# Patient Record
Sex: Female | Born: 1990
Health system: Southern US, Community
[De-identification: ages and names within clinical notes are randomized; demographics above are authoritative.]

## PROBLEM LIST (undated history)

## (undated) ENCOUNTER — Inpatient Hospital Stay (HOSPITAL_COMMUNITY): Payer: Self-pay

## (undated) DIAGNOSIS — I471 Supraventricular tachycardia, unspecified: Secondary | ICD-10-CM

## (undated) DIAGNOSIS — Z720 Tobacco use: Secondary | ICD-10-CM

## (undated) DIAGNOSIS — N390 Urinary tract infection, site not specified: Secondary | ICD-10-CM

## (undated) DIAGNOSIS — J309 Allergic rhinitis, unspecified: Secondary | ICD-10-CM

## (undated) HISTORY — DX: Tobacco use: Z72.0

## (undated) HISTORY — PX: NO PAST SURGERIES: SHX2092

## (undated) HISTORY — DX: Allergic rhinitis, unspecified: J30.9

---
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
## 2010-03-17 ENCOUNTER — Emergency Department (HOSPITAL_COMMUNITY): Admission: EM | Admit: 2010-03-17 | Discharge: 2010-03-17 | Payer: Self-pay | Admitting: Emergency Medicine

## 2010-04-07 ENCOUNTER — Encounter (INDEPENDENT_AMBULATORY_CARE_PROVIDER_SITE_OTHER): Payer: Self-pay | Admitting: *Deleted

## 2010-04-07 ENCOUNTER — Encounter: Payer: Self-pay | Admitting: Cardiology

## 2010-04-07 ENCOUNTER — Ambulatory Visit: Payer: Self-pay | Admitting: Cardiology

## 2010-04-07 DIAGNOSIS — R42 Dizziness and giddiness: Secondary | ICD-10-CM

## 2010-04-07 DIAGNOSIS — R002 Palpitations: Secondary | ICD-10-CM | POA: Insufficient documentation

## 2010-04-07 DIAGNOSIS — R0602 Shortness of breath: Secondary | ICD-10-CM

## 2010-04-08 LAB — CONVERTED CEMR LAB
Basophils Absolute: 0.1 10*3/uL (ref 0.0–0.1)
Basophils Relative: 0.4 % (ref 0.0–3.0)
Eosinophils Absolute: 0.2 10*3/uL (ref 0.0–0.7)
Eosinophils Relative: 1.7 % (ref 0.0–5.0)
Hemoglobin: 14.8 g/dL (ref 12.0–15.0)
Lymphocytes Relative: 20.3 % (ref 12.0–46.0)
Lymphocytes Relative: 21.3 % (ref 12.0–46.0)
MCHC: 34.1 g/dL (ref 30.0–36.0)
Monocytes Relative: 4.7 % (ref 3.0–12.0)
Monocytes Relative: 5.2 % (ref 3.0–12.0)
Neutro Abs: 6.7 10*3/uL (ref 1.4–7.7)
Neutrophils Relative %: 72.4 % (ref 43.0–77.0)
Platelets: 207 10*3/uL (ref 150.0–400.0)
RBC: 4.64 M/uL (ref 3.87–5.11)
RDW: 13.8 % (ref 11.5–14.6)
WBC: 9.1 10*3/uL (ref 4.5–10.5)
WBC: 9.4 10*3/uL (ref 4.5–10.5)

## 2010-04-19 ENCOUNTER — Encounter: Payer: Self-pay | Admitting: Cardiology

## 2010-04-19 ENCOUNTER — Ambulatory Visit: Payer: Self-pay

## 2010-04-19 ENCOUNTER — Ambulatory Visit: Payer: Self-pay | Admitting: Cardiovascular Disease

## 2010-04-19 ENCOUNTER — Ambulatory Visit (HOSPITAL_COMMUNITY): Admission: RE | Admit: 2010-04-19 | Discharge: 2010-04-19 | Payer: Self-pay | Admitting: Cardiology

## 2010-04-26 ENCOUNTER — Emergency Department (HOSPITAL_COMMUNITY): Admission: EM | Admit: 2010-04-26 | Discharge: 2010-04-26 | Payer: Self-pay | Admitting: Emergency Medicine

## 2010-06-18 ENCOUNTER — Encounter (INDEPENDENT_AMBULATORY_CARE_PROVIDER_SITE_OTHER): Payer: Self-pay | Admitting: *Deleted

## 2010-08-19 NOTE — Assessment & Plan Note (Signed)
Summary: eph/palp   CC:  pt complains of sob.  History of Present Illness: This is a 20 year old white female patient who recently went to the emergency room with palpitations. She states that her heartbeat and racing when she was cleaning her house and she bent over. She became dizzy and the whole episode lasted about 2 hours. In the emergency room she had sinus tachycardia at about 130 beats per minute. She was drinking 3-4 cans of caffeinated soda daily. She has cut back on this. She also continues to smoke 5 cigarettes a day and says she has cut back on this as well. She has had 4 episodes over the past 2 years, similar to this.  The patient continues to complain of fast heartbeat associated with dyspnea. She denies any stress or anxiety. She does not work or go to school and currently is caring for her 72-month-old baby. She denies chest pain pain at rest, or syncope. She denies any childhood illnesses out of the ordinary.She does not exercise regularly.  Current Medications (verified): 1)  None  Past History:  Past Medical History: Last updated: 04/06/2010 none   Family History: Reviewed history from 04/06/2010 and no changes required.  Paternal grandmother with high blood pressure and   diabetes.  Aunt and Grandfather with CABG  Social History: Reviewed history from 04/06/2010 and no changes required.  non-drinker, no drug abuse, not employed   27 month old baby  Smokes 5-6 cigarettes/day  Review of Systems       see the history of present illness, otherwise negative  Vital Signs:  Patient profile:   20 year old female Height:      67 inches Weight:      120 pounds BMI:     18.86 Pulse rate:   108 / minute Resp:     12 per minute BP sitting:   103 / 66  (left arm)  Vitals Entered By: Kem Parkinson (April 07, 2010 9:07 AM)  Physical Exam  General:   Well-nournished, in no acute distress. Neck: No JVD, HJR, Bruit, or thyroid enlargement Lungs: No  tachypnea, clear without wheezing, rales, or rhonchi Cardiovascular: RRR at 120 beats per minute, PMI  not displaced, midsystolic click, no murmurs, gallops, bruit, thrill, or heave. Abdomen: BS normal. Soft without organomegaly, masses, lesions or tenderness. Extremities: without cyanosis, clubbing or edema. Good distal pulses bilateral SKin: Warm, no lesions or rashes  Musculoskeletal: No deformities Neuro: no focal signs    EKG  Procedure date:  03/17/2010  Findings:      EKG from the emergency room showed sinus tachycardia at 131 beats per minute  Impression & Recommendations:  Problem # 1:  PALPITATIONS (ICD-785.1) Patient has an increase in palpitations associated with sinus tachycardia and dyspnea. She does drink excessive caffeine but has cut back on it. She seems somewhat anxious in the office and increases her heart rate while she is here. Will check a 2-D echo, and event recorder. We will also check a TSH and the below stated labs. Orders: TLB-TSH (Thyroid Stimulating Hormone) ([id number]) Echocardiogram (Echo) TLB-CBC Platelet - w/Differential ([id number]) T-D-Dimer Fibrin Derivatives Quantitive ([id number]) Event (Event)  Problem # 2:  TOBACCO ABUSE (ICD-305.1) I encouraged the patient to stop smoking.  Problem # 3:  DYSPNEA (ICD-786.05) dyspnea his eyes associated with the palpitations.  Patient Instructions: 1)  Your physician recommends that you schedule a follow-up appointment in:  FIRST AVAILABLE WITH ALLRED 2)  Your physician recommends that you return for lab  work in: today CBC  TSH D-DIMER 785.1 3)  Your physician has requested that you have an echocardiogram.  Echocardiography is a painless test that uses sound waves to create images of your heart. It provides your doctor with information about the size and shape of your heart and how well your heart's chambers and valves are working.  This procedure takes approximately one hour. There are no restrictions  for this procedure. 4)  Your physician has recommended that you wear an event monitor.  Event monitors are medical devices that record the heart's electrical activity. Doctors most often use these monitors to diagnose arrhythmias. Arrhythmias are problems with the speed or rhythm of the heartbeat. The monitor is a small, portable device. You can wear one while you do your normal daily activities. This is usually used to diagnose what is causing palpitations/syncope (passing out).

## 2010-08-19 NOTE — Miscellaneous (Signed)
Summary: EVENT MONITOR WILL BE MAIL TO PATIENT.  Clinical Lists Changes  PATIENT WAS HERE FOR APPT TODAY SHE NEED A EVEVT MONITOR  BUT THE MONITOR SHE NEEDED  WOULD HAVE  TO BE MAIL TO HER.

## 2010-08-19 NOTE — Letter (Signed)
Summary: Appointment - Missed  [facility], Main Office  [address]   [place], [place] [address]   Phone: [phone]  Fax: [phone]     June 18, 2010 MRN: [id number]   [facility] [place] [address] [place], [place]  [address]   Dear Ms. Fromer,  Our records indicate you missed your appointment on 06-09-10 with Dr. Johney Frame.                                    It is very important that we reach you to reschedule this appointment. We look forward to participating in your health care needs. Please contact us at the number listed above at your earliest convenience to reschedule this appointment.     Sincerely,    Glass blower/designer

## 2010-09-30 LAB — POCT URINALYSIS DIPSTICK
Bilirubin Urine: NEGATIVE
Nitrite: NEGATIVE
Protein, ur: NEGATIVE mg/dL
pH: 6 (ref 5.0–8.0)

## 2010-09-30 LAB — GC/CHLAMYDIA PROBE AMP, GENITAL
Chlamydia, DNA Probe: NEGATIVE
GC Probe Amp, Genital: NEGATIVE

## 2010-09-30 LAB — WET PREP, GENITAL
Trich, Wet Prep: NONE SEEN
Yeast Wet Prep HPF POC: NONE SEEN

## 2010-10-01 LAB — POCT I-STAT, CHEM 8
Creatinine, Ser: 0.8 mg/dL (ref 0.4–1.2)
Glucose, Bld: 78 mg/dL (ref 70–99)
Hemoglobin: 16.3 g/dL — ABNORMAL HIGH (ref 12.0–15.0)
Potassium: 3.6 mEq/L (ref 3.5–5.1)

## 2010-10-04 LAB — POCT URINALYSIS DIP (DEVICE)
Ketones, ur: NEGATIVE mg/dL
Protein, ur: NEGATIVE mg/dL
Specific Gravity, Urine: >=1.03 (ref 1.005–1.030)
pH: 5.5 (ref 5.0–8.0)

## 2010-10-04 LAB — URINE CULTURE
Colony Count: NO GROWTH
Culture: NO GROWTH

## 2010-10-26 LAB — RAPID URINE DRUG SCREEN, HOSP PERFORMED
Amphetamines: NOT DETECTED
Barbiturates: NOT DETECTED
Benzodiazepines: NOT DETECTED
Cocaine: NOT DETECTED
Opiates: NOT DETECTED
Tetrahydrocannabinol: NOT DETECTED

## 2010-10-26 LAB — CBC
HCT: 41.7 % (ref 36.0–49.0)
Hemoglobin: 14.4 g/dL (ref 12.0–16.0)
MCHC: 34.6 g/dL (ref 31.0–37.0)
MCV: 89.8 fL (ref 78.0–98.0)
Platelets: 198 10*3/uL (ref 150–400)
RBC: 4.64 MIL/uL (ref 3.80–5.70)
RDW: 13.7 % (ref 11.4–15.5)
WBC: 6.8 10*3/uL (ref 4.5–13.5)

## 2010-10-26 LAB — COMPREHENSIVE METABOLIC PANEL
ALT: 24 U/L (ref 0–35)
AST: 23 U/L (ref 0–37)
Albumin: 4.2 g/dL (ref 3.5–5.2)
Alkaline Phosphatase: 74 U/L (ref 47–119)
BUN: 12 mg/dL (ref 6–23)
CO2: 24 mEq/L (ref 19–32)
Calcium: 9.5 mg/dL (ref 8.4–10.5)
Chloride: 111 mEq/L (ref 96–112)
Creatinine, Ser: 0.87 mg/dL (ref 0.4–1.2)
Glucose, Bld: 84 mg/dL (ref 70–99)
Potassium: 4.1 mEq/L (ref 3.5–5.1)
Sodium: 142 mEq/L (ref 135–145)
Total Bilirubin: 0.5 mg/dL (ref 0.3–1.2)
Total Protein: 6.6 g/dL (ref 6.0–8.3)

## 2010-10-26 LAB — DIFFERENTIAL
Basophils Absolute: 0 10*3/uL (ref 0.0–0.1)
Basophils Relative: 1 % (ref 0–1)
Eosinophils Absolute: 0.1 10*3/uL (ref 0.0–1.2)
Eosinophils Relative: 2 % (ref 0–5)
Lymphocytes Relative: 43 % (ref 24–48)
Lymphs Abs: 2.9 10*3/uL (ref 1.1–4.8)
Monocytes Absolute: 0.4 10*3/uL (ref 0.2–1.2)
Monocytes Relative: 5 % (ref 3–11)
Neutro Abs: 3.4 10*3/uL (ref 1.7–8.0)
Neutrophils Relative %: 50 % (ref 43–71)

## 2010-10-26 LAB — PREGNANCY, URINE: Preg Test, Ur: NEGATIVE

## 2010-10-26 LAB — D-DIMER, QUANTITATIVE: D-Dimer, Quant: <0.22 ug/mL-FEU (ref 0.00–0.48)

## 2010-11-02 LAB — CBC
HCT: 35.7 % — ABNORMAL LOW (ref 36.0–49.0)
Hemoglobin: 11.8 g/dL — ABNORMAL LOW (ref 12.0–16.0)
MCHC: 33.6 g/dL (ref 31.0–37.0)
Platelets: 137 10*3/uL — ABNORMAL LOW (ref 150–400)
RBC: 3.77 MIL/uL — ABNORMAL LOW (ref 3.80–5.70)
WBC: 10 10*3/uL (ref 4.5–13.5)
WBC: 13.2 10*3/uL (ref 4.5–13.5)

## 2010-11-30 NOTE — Discharge Summary (Signed)
NAME:  Angela Guzman, Angela Guzman               ACCOUNT NO.:  [id number]   MEDICAL RECORD NO.:  [id number]          PATIENT TYPE:  INP   LOCATION:  9110                          FACILITY:  [facility]   PHYSICIAN:  Malachi Pro. Ambrose Mantle, M.D. DATE OF BIRTH:  27-Nov-1990   DATE OF ADMISSION:  08/29/2008  DATE OF DISCHARGE:                               DISCHARGE SUMMARY   An 20 year old white single female, para 0, gravida 1, EDC on August 29, 2008, admitted in labor.  Blood group and type O positive, negative  antibody, nonreactive serology, rubella immune, hepatitis B surface  antigen negative, HIV negative, GC and chlamydia negative, and quad  screen negative.  One-hour Glucola 110.  Group B strep negative.  Vaginal ultrasound on March 04, 2008, biparietal diameter 2.75 cm,  average gestational age, 40 weeks and 4 days, and EDC on August 29, 2008.  Repeat ultrasound April 07, 2008, showed incomplete anatomy.  Repeat ultrasound on May 05, 2008, average gestational age to 22  weeks and 6 days.  The patient was treated for UTI on June 05, 2008,  with Macrobid.  At approximately 8:00 p.m. on August 29, 2008, the  patient began contracting and came to the Maternity Admission Unit for  evaluation.  The cervix was 4 cm.  She was admitted and received an  epidural.   PAST MEDICAL HISTORY:  No known allergies.  No operations.  No  illnesses.  Alcohol, tobacco, and drugs none.   FAMILY HISTORY:  Paternal grandmother with high blood pressure and  diabetes.   PHYSICAL EXAMINATION:  VITAL SIGNS:  Normal vital signs.  HEART:  Normal  LUNGS:  Normal.  ABDOMEN: Soft and nontender.   Fundal height had been 38 cm on August 22, 2008.  Fetal heart tones  normal.  Cervix 7-8 cm, 100% vertex at a -1.  Artificial rupture of the  membranes produced clear fluid.  The patient had received an epidural  and active labor had slowed.  She was begun on low-dose Pitocin.  She  reached full dilatation, pushed  well, and delivered a living female  infant, 8 pounds 0 ounce with Apgars of 9 at 1 and 9 at 5 minutes over  an intact perineum.  Dr. Ambrose Mantle was in attendance.  Placenta delivered  slowly, but intact.  Uterus was normal.  Right labial laceration  repaired with 2-0 Vicryl.  Blood loss was 400 mL.  Postpartum, the  patient did well and was discharged on the second postpartum day.  RPR  was nonreactive.  Initial hemoglobin 12.1, hematocrit 35.7, white count  13,200, and platelet count 137,000.  Follow up hemoglobin of 11.8 and  platelet count of 105,000.   FINAL DIAGNOSES:  1. Intrauterine pregnancy at 40 weeks, delivered vertex.  2. Operation spontaneous delivery vertex.  3. Repair of right labial laceration.   FINAL CONDITION:  Improved.   INSTRUCTIONS:  Include our regular discharge instruction booklet.  Prescriptions for Darvocet-N 100, 36 tablets 1 every 4-6 hours as needed  for pain and Motrin 600 mg, 30 tablets 1 every 6 hours as needed for  pain that was given at discharge.  The patient was seen in consultation  by social work because of her age.  Social worker reviewed the chart and  spoke at bedside to the RN.  The RN reported that the patient was very  appropriate with the baby and for the most part, the patient just needed  teaching and education.  The social worker met with the patient and the  father of the baby and informed them of the homebound school program on  how to make arrangements for that.  Also, made the patient aware of the  [facility] program for mothers.  The patient stated that she had a great  support and had no concerns about taking the baby home.  The patient is  to return to the office in 6 weeks for followup examination.      Malachi Pro. Ambrose Mantle, M.D.  Electronically Signed     TFH/MEDQ  D:  09/01/2008  T:  09/01/2008  Job:  [id number]

## 2010-12-21 ENCOUNTER — Emergency Department (HOSPITAL_COMMUNITY): Payer: Self-pay

## 2010-12-21 ENCOUNTER — Emergency Department (HOSPITAL_COMMUNITY)
Admission: EM | Admit: 2010-12-21 | Discharge: 2010-12-21 | Disposition: A | Discharge status: Home / Self Care | Payer: Self-pay | Attending: Emergency Medicine | Admitting: Emergency Medicine

## 2010-12-21 DIAGNOSIS — R002 Palpitations: Secondary | ICD-10-CM | POA: Insufficient documentation

## 2010-12-21 DIAGNOSIS — R079 Chest pain, unspecified: Secondary | ICD-10-CM | POA: Insufficient documentation

## 2010-12-21 DIAGNOSIS — R5381 Other malaise: Secondary | ICD-10-CM | POA: Insufficient documentation

## 2010-12-21 LAB — POCT I-STAT, CHEM 8
BUN: 12 mg/dL (ref 6–23)
Chloride: 105 mEq/L (ref 96–112)
Creatinine, Ser: 0.9 mg/dL (ref 0.4–1.2)
Glucose, Bld: 83 mg/dL (ref 70–99)
Hemoglobin: 15 g/dL (ref 12.0–15.0)
Potassium: 4.3 mEq/L (ref 3.5–5.1)

## 2010-12-21 LAB — RAPID URINE DRUG SCREEN, HOSP PERFORMED
Benzodiazepines: NOT DETECTED
Cocaine: NOT DETECTED
Opiates: NOT DETECTED

## 2010-12-21 LAB — PREGNANCY, URINE: Preg Test, Ur: NEGATIVE

## 2011-08-27 ENCOUNTER — Emergency Department (HOSPITAL_COMMUNITY)
Admission: EM | Admit: 2011-08-27 | Discharge: 2011-08-27 | Disposition: A | Discharge status: Home Health | Payer: Self-pay | Source: Home / Self Care | Attending: Emergency Medicine | Admitting: Emergency Medicine

## 2011-08-27 ENCOUNTER — Encounter (HOSPITAL_COMMUNITY): Payer: Self-pay | Admitting: *Deleted

## 2011-08-27 DIAGNOSIS — N390 Urinary tract infection, site not specified: Secondary | ICD-10-CM

## 2011-08-27 HISTORY — DX: Urinary tract infection, site not specified: N39.0

## 2011-08-27 LAB — POCT URINALYSIS DIP (DEVICE)
Glucose, UA: NEGATIVE mg/dL
Nitrite: POSITIVE — AB
Protein, ur: 100 mg/dL — AB
Specific Gravity, Urine: >=1.03 (ref 1.005–1.030)
Urobilinogen, UA: 0.2 mg/dL (ref 0.0–1.0)
pH: 6.5 (ref 5.0–8.0)

## 2011-08-27 MED ORDER — PHENAZOPYRIDINE HCL 200 MG PO TABS: 200.0000 mg | ORAL_TABLET | Freq: Three times a day (TID) | ORAL | Status: AC | PRN

## 2011-08-27 MED ORDER — SULFAMETHOXAZOLE-TRIMETHOPRIM 800-160 MG PO TABS: 1.0000 | ORAL_TABLET | Freq: Two times a day (BID) | ORAL | Status: AC

## 2011-08-27 NOTE — ED Notes (Signed)
Pt with c/o urinary frequency/urgency x 4 days pain with urination onset today

## 2011-08-27 NOTE — ED Provider Notes (Signed)
History     CSN: [id number]  Arrival date & time 08/27/11  7829   First MD Initiated Contact with Patient 08/27/11 1041      Chief Complaint  Patient presents with  . Urinary Frequency  . Dysuria    (Consider location/radiation/quality/duration/timing/severity/associated sxs/prior treatment) HPI Comments: Patient with urinary urgency, frequency, dysuria x4 days. Reports lower abd pressure after urinating. Reports some hematuria today. Has been drinking extra cranberry juice without relief. No cloudy, oderous urine. No other abdominal pain, back pain. No nausea, vomiting, fevers, vaginal bleeding, vaginal discharge. Sexually active with the same female partner who is asymptomatic. States they use condoms consistently. STDs not a concern today. States this feels identical to previous UTIs. No history of gonorrhea, Chlamydia, yeast infections, trichomonas, BV, herpes, HIV, syphilis.  ROS as noted in HPI. All other ROS negative.   Patient is a 21 y.o. female presenting with frequency and dysuria. The history is provided by the patient. No language interpreter was used.  Urinary Frequency This is a new problem. The current episode started more than 2 days ago. The problem occurs constantly. Associated symptoms include abdominal pain. The symptoms are relieved by nothing. She has tried nothing for the symptoms.  Dysuria  Associated symptoms include frequency.    Past Medical History  Diagnosis Date  . UTI (lower urinary tract infection)     History reviewed. No pertinent past surgical history.  History reviewed. No pertinent family history.  History  Substance Use Topics  . Smoking status: Never Smoker   . Smokeless tobacco: Not on file  . Alcohol Use: No    OB History    Grav Para Term Preterm Abortions TAB SAB Ect Mult Living                  Review of Systems  Gastrointestinal: Positive for abdominal pain.  Genitourinary: Positive for dysuria and frequency.     Allergies  Review of patient's allergies indicates no known allergies.  Home Medications   Current Outpatient Rx  Name Route Sig Dispense Refill  . PHENAZOPYRIDINE HCL 200 MG PO TABS Oral Take 1 tablet (200 mg total) by mouth 3 (three) times daily as needed for pain. 6 tablet 0  . SULFAMETHOXAZOLE-TRIMETHOPRIM 800-160 MG PO TABS Oral Take 1 tablet by mouth 2 (two) times daily. 6 tablet 0    BP 113/62  Pulse 85  Temp(Src) 99.1 F (37.3 C) (Oral)  Resp 18  SpO2 100%  LMP 07/29/2011  Physical Exam  Nursing note and vitals reviewed. Constitutional: She is oriented to person, place, and time. She appears well-developed and well-nourished. No distress.  HENT:  Head: Normocephalic and atraumatic.  Eyes: Conjunctivae and EOM are normal.  Neck: Normal range of motion.  Cardiovascular: Normal rate.   Pulmonary/Chest: Effort normal.  Abdominal: Soft. Bowel sounds are normal. She exhibits no distension. There is tenderness in the suprapubic area. There is no rebound and no CVA tenderness.  Musculoskeletal: Normal range of motion.  Neurological: She is alert and oriented to person, place, and time.  Skin: Skin is warm and dry.  Psychiatric: She has a normal mood and affect. Her behavior is normal. Judgment and thought content normal.    ED Course  Procedures (including critical care time)  Labs Reviewed  POCT URINALYSIS DIP (DEVICE) - Abnormal; Notable for the following:    Bilirubin Urine SMALL (*)    Ketones, ur TRACE (*)    Hgb urine dipstick LARGE (*)    Protein,  ur 100 (*)    Nitrite POSITIVE (*)    Leukocytes, UA LARGE (*) Biochemical Testing Only. Please order routine urinalysis from main lab if confirmatory testing is needed.   All other components within normal limits  POCT PREGNANCY, URINE   No results found.   1. UTI (lower urinary tract infection)     Results for orders placed during the hospital encounter of 08/27/11  POCT URINALYSIS DIP (DEVICE)       Component Value Range   Glucose, UA NEGATIVE  NEGATIVE (mg/dL)   Bilirubin Urine SMALL (*) NEGATIVE    Ketones, ur TRACE (*) NEGATIVE (mg/dL)   Specific Gravity, Urine >=1.030  1.005 - 1.030    Hgb urine dipstick LARGE (*) NEGATIVE    pH 6.5  5.0 - 8.0    Protein, ur 100 (*) NEGATIVE (mg/dL)   Urobilinogen, UA 0.2  0.0 - 1.0 (mg/dL)   Nitrite POSITIVE (*) NEGATIVE    Leukocytes, UA LARGE (*) NEGATIVE   POCT PREGNANCY, URINE      Component Value Range   Preg Test, Ur NEGATIVE  NEGATIVE     MDM  Previous labs reviewed. Patient declined pelvic exam.  Luiz Blare, MD 08/27/11 1118

## 2012-04-29 ENCOUNTER — Emergency Department (INDEPENDENT_AMBULATORY_CARE_PROVIDER_SITE_OTHER)
Admission: EM | Admit: 2012-04-29 | Discharge: 2012-04-29 | Disposition: A | Discharge status: Home / Self Care | Payer: Self-pay | Source: Home / Self Care | Attending: Family Medicine | Admitting: Family Medicine

## 2012-04-29 ENCOUNTER — Encounter (HOSPITAL_COMMUNITY): Payer: Self-pay | Admitting: Emergency Medicine

## 2012-04-29 DIAGNOSIS — N739 Female pelvic inflammatory disease, unspecified: Secondary | ICD-10-CM

## 2012-04-29 LAB — WET PREP, GENITAL: Trich, Wet Prep: NONE SEEN

## 2012-04-29 MED ORDER — CEFTRIAXONE SODIUM 1 G IJ SOLR
1.0000 g | Freq: Once | INTRAMUSCULAR | Status: AC
  Administered 2012-04-29: 1 g via INTRAMUSCULAR

## 2012-04-29 MED ORDER — CEFTRIAXONE SODIUM 1 G IJ SOLR
INTRAMUSCULAR | Status: AC
  Filled 2012-04-29: qty 10

## 2012-04-29 MED ORDER — METRONIDAZOLE 500 MG PO TABS: 500.0000 mg | ORAL_TABLET | Freq: Three times a day (TID) | ORAL | Status: DC

## 2012-04-29 MED ORDER — AZITHROMYCIN 250 MG PO TABS
ORAL_TABLET | ORAL | Status: AC
  Filled 2012-04-29: qty 4

## 2012-04-29 MED ORDER — AZITHROMYCIN 250 MG PO TABS
1000.0000 mg | ORAL_TABLET | Freq: Once | ORAL | Status: AC
  Administered 2012-04-29: 1000 mg via ORAL

## 2012-04-29 NOTE — ED Provider Notes (Signed)
History     CSN: [id number]  Arrival date & time 04/29/12  0945   First MD Initiated Contact with Patient 04/29/12 [phone]      Chief Complaint  Patient presents with  . Foreign Body in Vagina    (Consider location/radiation/quality/duration/timing/severity/associated sxs/prior treatment) Patient is a 21 y.o. female presenting with vaginal discharge. The history is provided by the patient.  Vaginal Discharge This is a new problem. The current episode started more than 2 days ago. The problem has been gradually worsening. Associated symptoms comments: Concerned about retained tampon..    Past Medical History  Diagnosis Date  . UTI (lower urinary tract infection)     History reviewed. No pertinent past surgical history.  No family history on file.  History  Substance Use Topics  . Smoking status: Current Every Day Smoker -- 0.5 packs/day    Types: Cigarettes  . Smokeless tobacco: Not on file  . Alcohol Use: No    OB History    Grav Para Term Preterm Abortions TAB SAB Ect Mult Living                  Review of Systems  Constitutional: Negative.   Gastrointestinal: Negative.   Genitourinary: Positive for vaginal discharge and vaginal pain. Negative for dysuria and vaginal bleeding.    Allergies  Review of patient's allergies indicates no known allergies.  Home Medications   Current Outpatient Rx  Name Route Sig Dispense Refill  . METRONIDAZOLE 500 MG PO TABS Oral Take 1 tablet (500 mg total) by mouth 3 (three) times daily. 21 tablet 0    BP 111/74  Pulse 88  Temp 97.2 F (36.2 C) (Oral)  Resp 16  SpO2 97%  LMP 04/20/2012  Physical Exam  Nursing note and vitals reviewed. Constitutional: She is oriented to person, place, and time. She appears well-developed and well-nourished.  Abdominal: Soft. Bowel sounds are normal. She exhibits no distension and no mass. There is no tenderness. There is no rebound and no guarding.  Genitourinary: Uterus normal.  Cervix exhibits discharge and friability. Right adnexum displays no mass and no tenderness. Left adnexum displays no mass and no tenderness. There is erythema around the vagina. Vaginal discharge found.  Neurological: She is alert and oriented to person, place, and time.  Skin: Skin is warm and dry.    ED Course  Procedures (including critical care time)   Labs Reviewed  GC/CHLAMYDIA PROBE AMP, GENITAL  WET PREP, GENITAL   No results found.   1. Pelvic inflammatory disease (PID)       MDM          Linna Hoff, MD 04/29/12 1056

## 2012-04-29 NOTE — ED Notes (Signed)
Thinks she lost a tampon 4 days ago. Started with odor 3 days ago.

## 2012-05-01 NOTE — ED Notes (Signed)
GC/Chlamydia neg., Wet prep: mod. clue cells, WBC's TNTC.  Pt. adequately treated with Flagyl. Angela Guzman 05/01/2012

## 2012-05-02 NOTE — ED Notes (Addendum)
Patient called to inquire about her lab reports. After verifying ID, discussed negative findings. Wet prep reveled WBG count TNTC w positive clue cells ;  has Rx for flagyl (usual treatment) advised to complete Rx as written

## 2012-07-20 ENCOUNTER — Encounter: Payer: Self-pay | Admitting: *Deleted

## 2012-07-20 ENCOUNTER — Ambulatory Visit: Payer: Self-pay | Admitting: Cardiology

## 2012-07-24 ENCOUNTER — Encounter: Payer: Self-pay | Admitting: Cardiology

## 2012-07-24 ENCOUNTER — Telehealth: Payer: Self-pay | Admitting: Cardiology

## 2012-07-24 ENCOUNTER — Ambulatory Visit (INDEPENDENT_AMBULATORY_CARE_PROVIDER_SITE_OTHER): Payer: Self-pay | Admitting: Cardiology

## 2012-07-24 VITALS — BP 122/72 | HR 92 | Ht 67.0 in | Wt 129.0 lb

## 2012-07-24 DIAGNOSIS — I471 Supraventricular tachycardia: Secondary | ICD-10-CM | POA: Insufficient documentation

## 2012-07-24 DIAGNOSIS — I498 Other specified cardiac arrhythmias: Secondary | ICD-10-CM

## 2012-07-24 MED ORDER — METOPROLOL TARTRATE 25 MG PO TABS: ORAL_TABLET | ORAL | Status: DC

## 2012-07-24 NOTE — Patient Instructions (Addendum)
Your physician has requested that you have an echocardiogram. Echocardiography is a painless test that uses sound waves to create images of your heart. It provides your doctor with information about the size and shape of your heart and how well your heart's chambers and valves are working. This procedure takes approximately one hour. There are no restrictions for this procedure.  Your physician has recommended that you wear an event monitor. Event monitors are medical devices that record the heart's electrical activity. Doctors most often [place] these monitors to diagnose arrhythmias. Arrhythmias are problems with the speed or rhythm of the heartbeat. The monitor is a small, portable device. You can wear one while you do your normal daily activities. This is usually used to diagnose what is causing palpitations/syncope (passing out). 30 day monitor  Your physician recommends that you schedule a follow-up appointment in: 2 months with Dr Shirlee Latch.

## 2012-07-24 NOTE — Telephone Encounter (Signed)
Patient was in the office today.  Dr. Shirlee Latch ordered Event monitor.[place] spoke with Sue Lush about Ms. Cushman not having insurance. Patient was offered [employer] papers - she refused - wants to wait til she get Medicaid.  Sue Lush is aware.

## 2012-07-24 NOTE — Progress Notes (Signed)
Patient ID: Angela Guzman, female   DOB: 26-Jun-1991, 22 y.o.   MRN: [id number] 22 y.o. presents for evaluation of palpitations.  She has noted episodes of her heart racing since she was 11.  However, for the last 3-4 years, episodes have become more frequent.  She was in the ER in [place] in 12/13 with a severe episode of heart racing.  She was lightheaded, hot/diaphoretic, and felt nauseated.  In the ER, she says that her HR was in the 200s.  It sounds like she was given adenosine, which slowed her HR.  She thinks they told she had SVT.  She was sent home from the ER on metoprolol 12.5 mg bid.  She had never been on meds prior to this.  She has not had tachypalpitations since discharge from the ER. She had been having episodes of heart racing approximately monthly prior to December.  She had never passed out.  She would get chest tightness with the racing heart rate.  She rarely drinks caffeine.  She smokes and occasionally drinks ETOH.  She denies use of any stimulant drugs.    ECG: NSR, possible right atrial enlargement, right axis deviation, no prolonged QT, no evidence for pre-excitation.   PMH: 1. SVT: Palpitations x yrs.  ER visit 12/13 with SVT, sounds like she received adenosine.  Echo (2011) with EF 60-65%, mitral valve with systolic bowing without prolapse.  2. H/o UTIs, PID  SH: Smokes about 1/2 ppd, 1 child, unemployed. Occasional ETOH.   FH: Aunt and grandfather with CAD s/p CABG, aunt with ? Catheter ablation of arrhythmia.   ROS: All systems reviewed and negative except as per HPI.   Current Outpatient Prescriptions  Medication Sig Dispense Refill  . metoprolol tartrate (LOPRESSOR) 25 MG tablet 1/2 tablet (total 12.5mg ) two times a day  30 tablet  6   BP 122/72  Pulse 92  Ht 5\' 7"  (1.702 m)  Wt 129 lb (58.514 kg)  BMI 20.20 kg/m2 General: NAD Neck: No JVD, no thyromegaly or thyroid nodule.  Lungs: Clear to auscultation bilaterally with normal respiratory effort. CV:  Nondisplaced PMI.  Heart regular S1/S2, no S3/S4, no murmur.  No peripheral edema.  No carotid bruit.  Normal pedal pulses.  Abdomen: Soft, nontender, no hepatosplenomegaly, no distention.  Skin: Intact without lesions or rashes.  Neurologic: Alert and oriented x 3.  Psych: Normal affect. Extremities: No clubbing or cyanosis.  HEENT: Normal.   Assessment/Plan: 22 y.o. 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  No evidence from ECG for pre-excitation or long QT syndrome.  She was seen in the ER in 12/13 with an episodes that apparently broke with adenosine.  We talked today about avoiding caffeine and stimulant medications/drugs.  I will set her up with a 30 day monitor to try to document the arrhythmia.  I will get an echocardiogram.  She will continue metoprolol 12.5 mg bid.  She will followup in 2 months.   Marca Ancona 07/24/2012 10:25 AM

## 2012-07-30 ENCOUNTER — Ambulatory Visit (HOSPITAL_COMMUNITY): Payer: Self-pay | Attending: Cardiovascular Disease

## 2012-07-30 DIAGNOSIS — I059 Rheumatic mitral valve disease, unspecified: Secondary | ICD-10-CM | POA: Insufficient documentation

## 2012-07-30 DIAGNOSIS — F17200 Nicotine dependence, unspecified, uncomplicated: Secondary | ICD-10-CM | POA: Insufficient documentation

## 2012-07-30 DIAGNOSIS — I471 Supraventricular tachycardia, unspecified: Secondary | ICD-10-CM | POA: Insufficient documentation

## 2012-07-30 DIAGNOSIS — I369 Nonrheumatic tricuspid valve disorder, unspecified: Secondary | ICD-10-CM | POA: Insufficient documentation

## 2012-07-30 DIAGNOSIS — I379 Nonrheumatic pulmonary valve disorder, unspecified: Secondary | ICD-10-CM | POA: Insufficient documentation

## 2012-07-30 NOTE — Progress Notes (Signed)
Echocardiogram performed.  

## 2012-08-03 ENCOUNTER — Emergency Department (HOSPITAL_COMMUNITY)
Admission: EM | Admit: 2012-08-03 | Discharge: 2012-08-03 | Disposition: A | Discharge status: Home / Self Care | Payer: Self-pay | Attending: Emergency Medicine | Admitting: Emergency Medicine

## 2012-08-03 ENCOUNTER — Encounter (HOSPITAL_COMMUNITY): Payer: Self-pay | Admitting: *Deleted

## 2012-08-03 ENCOUNTER — Telehealth: Payer: Self-pay | Admitting: Cardiology

## 2012-08-03 DIAGNOSIS — K648 Other hemorrhoids: Secondary | ICD-10-CM | POA: Insufficient documentation

## 2012-08-03 DIAGNOSIS — F17200 Nicotine dependence, unspecified, uncomplicated: Secondary | ICD-10-CM | POA: Insufficient documentation

## 2012-08-03 DIAGNOSIS — Z8709 Personal history of other diseases of the respiratory system: Secondary | ICD-10-CM | POA: Insufficient documentation

## 2012-08-03 DIAGNOSIS — Z8679 Personal history of other diseases of the circulatory system: Secondary | ICD-10-CM | POA: Insufficient documentation

## 2012-08-03 DIAGNOSIS — R1032 Left lower quadrant pain: Secondary | ICD-10-CM | POA: Insufficient documentation

## 2012-08-03 DIAGNOSIS — K921 Melena: Secondary | ICD-10-CM | POA: Insufficient documentation

## 2012-08-03 DIAGNOSIS — Z87440 Personal history of urinary (tract) infections: Secondary | ICD-10-CM | POA: Insufficient documentation

## 2012-08-03 DIAGNOSIS — Z79899 Other long term (current) drug therapy: Secondary | ICD-10-CM | POA: Insufficient documentation

## 2012-08-03 HISTORY — DX: Supraventricular tachycardia, unspecified: I47.10

## 2012-08-03 HISTORY — DX: Supraventricular tachycardia: I47.1

## 2012-08-03 LAB — CBC WITH DIFFERENTIAL/PLATELET
Basophils Absolute: 0 10*3/uL (ref 0.0–0.1)
Basophils Relative: 1 % (ref 0–1)
Eosinophils Relative: 1 % (ref 0–5)
HCT: 44.1 % (ref 36.0–46.0)
MCHC: 36.3 g/dL — ABNORMAL HIGH (ref 30.0–36.0)
Monocytes Absolute: 0.3 10*3/uL (ref 0.1–1.0)
Neutro Abs: 3.4 10*3/uL (ref 1.7–7.7)
RDW: 12.5 % (ref 11.5–15.5)

## 2012-08-03 LAB — BASIC METABOLIC PANEL
Calcium: 9.2 mg/dL (ref 8.4–10.5)
Chloride: 105 mEq/L (ref 96–112)
Creatinine, Ser: 0.83 mg/dL (ref 0.50–1.10)
GFR calc Af Amer: >90 mL/min (ref >90)

## 2012-08-03 LAB — OCCULT BLOOD, POC DEVICE: Fecal Occult Bld: POSITIVE — AB

## 2012-08-03 MED ORDER — ONDANSETRON HCL 4 MG/2ML IJ SOLN
4.0000 mg | Freq: Once | INTRAMUSCULAR | Status: AC
  Administered 2012-08-03: 4 mg via INTRAVENOUS
  Filled 2012-08-03: qty 2

## 2012-08-03 MED ORDER — MORPHINE SULFATE 4 MG/ML IJ SOLN
4.0000 mg | Freq: Once | INTRAMUSCULAR | Status: AC
  Administered 2012-08-03: 2 mg via INTRAVENOUS
  Filled 2012-08-03: qty 1

## 2012-08-03 MED ORDER — POLYETHYLENE GLYCOL 3350 17 GM/SCOOP PO POWD: 17.0000 g | Freq: Two times a day (BID) | ORAL | Status: DC

## 2012-08-03 NOTE — ED Provider Notes (Signed)
History     CSN: [id number]  Arrival date & time 08/03/12  0910   First MD Initiated Contact with Patient 08/03/12 [phone]      Chief Complaint  Patient presents with  . Rectal Bleeding    (Consider location/radiation/quality/duration/timing/severity/associated sxs/prior treatment) HPI Comments: Patient is a 22 year old female who presents with an episode of rectal bleeding this morning. Patient reports urinating and feeling something come out of her rectum and noticed fresh red blood when she looked in the toilet. She is sure the blood is from her rectum and not vaginal or urethral. Patient denies pain. She denies a history of using blood thinners, constipation, chronic diarrhea, or rectal trauma. She denies any associated symptoms. No aggravating/alleviating factors.   Patient is a 22 y.o. female presenting with hematochezia.  Rectal Bleeding     Past Medical History  Diagnosis Date  . UTI (lower urinary tract infection)   . Palpitations   . Tachypnea   . SVT (supraventricular tachycardia)     dx'd 12/17    No past surgical history on file.  Family History  Problem Relation Age of Onset  . Heart Problems      cabg  . Heart Problems      cabg  . Hypertension Paternal Grandmother   . Diabetes Paternal Grandmother     History  Substance Use Topics  . Smoking status: Current Every Day Smoker -- 0.5 packs/day    Types: Cigarettes  . Smokeless tobacco: Not on file  . Alcohol Use: No    OB History    Grav Para Term Preterm Abortions TAB SAB Ect Mult Living                  Review of Systems  Gastrointestinal: Positive for hematochezia and anal bleeding.  All other systems reviewed and are negative.    Allergies  Review of patient's allergies indicates no known allergies.  Home Medications   Current Outpatient Rx  Name  Route  Sig  Dispense  Refill  . METOPROLOL TARTRATE 25 MG PO TABS   Oral   Take 12.5 mg by mouth 2 (two) times daily. 1/2 tablet (total  12.5mg ) two times a day           BP 121/70  Pulse 73  Temp 98.1 F (36.7 C) (Oral)  Resp 18  Ht 5\' 7"  (1.702 m)  Wt 129 lb (58.514 kg)  BMI 20.20 kg/m2  SpO2 100%  LMP 07/13/2012  Physical Exam  Nursing note and vitals reviewed. Constitutional: She is oriented to person, place, and time. She appears well-developed and well-nourished. No distress.  HENT:  Head: Normocephalic and atraumatic.  Eyes: Conjunctivae normal are normal. No scleral icterus.  Neck: Normal range of motion. Neck supple.  Cardiovascular: Normal rate and regular rhythm.  Exam reveals no gallop and no friction rub.   No murmur heard. Pulmonary/Chest: Effort normal and breath sounds normal. She has no wheezes. She has no rales. She exhibits no tenderness.  Abdominal: Soft. She exhibits no distension. There is no tenderness. There is no rebound and no guarding.  Genitourinary: Guaiac positive stool.       Multiple external and internal hemorrhoids noted on exam. Blood noted on rectal exam but unable to identify a specific source.   Musculoskeletal: Normal range of motion.  Neurological: She is alert and oriented to person, place, and time.       Speech is goal-oriented. Moves limbs without ataxia.   Skin:  Skin is warm and dry.  Psychiatric: She has a normal mood and affect. Her behavior is normal.    ED Course  Procedures (including critical care time)  Labs Reviewed  OCCULT BLOOD, POC DEVICE - Abnormal; Notable for the following:    Fecal Occult Bld POSITIVE (*)     All other components within normal limits  CBC WITH DIFFERENTIAL - Abnormal; Notable for the following:    Hemoglobin 16.0 (*)     MCHC 36.3 (*)     All other components within normal limits  BASIC METABOLIC PANEL   No results found.   1. Internal hemorrhoids       MDM  11:33 AM Labs unremarkable. Hemoccult positive with internal hemorrhoids noted on exam. Patient feeling better after morphine for LLQ pain which started which  she was in the ED and appears to be unrelated to the rectal bleeding. Patient not anemic. Patient will be discharged with instruction for treatment of internal hemorrhoids. Vitals stable. Patient stable for discharge.         Emilia Beck, PA-C 08/03/12 1422

## 2012-08-03 NOTE — Telephone Encounter (Signed)
Pt has medication questions pt had bleeding coming out of her butt and she wants to see if it her meds or what

## 2012-08-03 NOTE — ED Notes (Signed)
Pt states that she began to have abd pain yesterday and she noticed that she had an episode yesterday of bright red bleeding after a stool yesterday (states that the bleeding was minimal).  This am 0810 she states that she had a large BM that was again bright red in color.  States that she has mild abd discomfort.  She is on Lopressor for control of her recent dx of SVT (Dr. Shirlee Latch).  She denies taking any blood thinners or aspirin recently.  Denies any trauma.

## 2012-08-03 NOTE — Telephone Encounter (Signed)
Spoke with pt. Pt states she had BRB per rectum this morning. Pt states no other symptoms.  She denies having her period right now. I advised pt to report to urgent care facility to be evaluated.

## 2012-08-03 NOTE — ED Provider Notes (Signed)
Medical screening examination/treatment/procedure(s) were conducted as a shared visit with non-physician practitioner(s) and myself.  I personally evaluated the patient during the encounter  Appears well.  BRBPR today.  No straining. Abdomen soft and nontender.  Glynn Octave, MD 08/03/12 1524

## 2012-08-29 ENCOUNTER — Encounter (INDEPENDENT_AMBULATORY_CARE_PROVIDER_SITE_OTHER): Payer: BC Managed Care – PPO

## 2012-08-29 ENCOUNTER — Telehealth: Payer: Self-pay | Admitting: *Deleted

## 2012-08-29 DIAGNOSIS — I471 Supraventricular tachycardia: Secondary | ICD-10-CM

## 2012-08-29 DIAGNOSIS — I498 Other specified cardiac arrhythmias: Secondary | ICD-10-CM

## 2012-08-29 NOTE — Telephone Encounter (Signed)
Event  Monitor placed on Pt 08/29/12 TK

## 2012-09-26 ENCOUNTER — Other Ambulatory Visit (HOSPITAL_COMMUNITY)
Admission: RE | Admit: 2012-09-26 | Discharge: 2012-09-26 | Disposition: A | Discharge status: Home / Self Care | Payer: BC Managed Care – PPO | Source: Ambulatory Visit | Attending: Internal Medicine | Admitting: Internal Medicine

## 2012-09-26 ENCOUNTER — Other Ambulatory Visit: Payer: Self-pay | Admitting: Emergency Medicine

## 2012-09-26 DIAGNOSIS — Z01419 Encounter for gynecological examination (general) (routine) without abnormal findings: Secondary | ICD-10-CM | POA: Insufficient documentation

## 2012-10-01 ENCOUNTER — Ambulatory Visit: Payer: Self-pay | Admitting: Cardiology

## 2012-10-03 ENCOUNTER — Telehealth: Payer: Self-pay | Admitting: *Deleted

## 2012-10-03 NOTE — Telephone Encounter (Signed)
Dr Shirlee Latch reviewed monitor done 08/29/12-09/27/12. Mild sinus tachycardia once. Otherwise normal. Pt notified.

## 2012-10-04 ENCOUNTER — Encounter: Payer: Self-pay | Admitting: Cardiology

## 2013-04-24 ENCOUNTER — Other Ambulatory Visit: Payer: Self-pay | Admitting: *Deleted

## 2013-04-24 MED ORDER — METOPROLOL TARTRATE 25 MG PO TABS: 12.5000 mg | ORAL_TABLET | Freq: Two times a day (BID) | ORAL | Status: DC

## 2013-04-25 ENCOUNTER — Encounter (HOSPITAL_COMMUNITY): Payer: Self-pay | Admitting: Emergency Medicine

## 2013-04-25 ENCOUNTER — Emergency Department (HOSPITAL_COMMUNITY)
Admission: EM | Admit: 2013-04-25 | Discharge: 2013-04-25 | Disposition: A | Discharge status: Home / Self Care | Payer: BC Managed Care – PPO | Attending: Emergency Medicine | Admitting: Emergency Medicine

## 2013-04-25 DIAGNOSIS — Z3202 Encounter for pregnancy test, result negative: Secondary | ICD-10-CM | POA: Insufficient documentation

## 2013-04-25 DIAGNOSIS — R000 Tachycardia, unspecified: Secondary | ICD-10-CM | POA: Insufficient documentation

## 2013-04-25 DIAGNOSIS — Z87440 Personal history of urinary (tract) infections: Secondary | ICD-10-CM | POA: Insufficient documentation

## 2013-04-25 DIAGNOSIS — I471 Supraventricular tachycardia: Secondary | ICD-10-CM

## 2013-04-25 DIAGNOSIS — F17200 Nicotine dependence, unspecified, uncomplicated: Secondary | ICD-10-CM | POA: Insufficient documentation

## 2013-04-25 DIAGNOSIS — R0602 Shortness of breath: Secondary | ICD-10-CM | POA: Insufficient documentation

## 2013-04-25 DIAGNOSIS — I498 Other specified cardiac arrhythmias: Secondary | ICD-10-CM | POA: Insufficient documentation

## 2013-04-25 DIAGNOSIS — Z79899 Other long term (current) drug therapy: Secondary | ICD-10-CM | POA: Insufficient documentation

## 2013-04-25 LAB — POCT PREGNANCY, URINE: Preg Test, Ur: NEGATIVE

## 2013-04-25 LAB — POCT I-STAT, CHEM 8
HCT: 48 % — ABNORMAL HIGH (ref 36.0–46.0)
Hemoglobin: 16.3 g/dL — ABNORMAL HIGH (ref 12.0–15.0)
Sodium: 140 mEq/L (ref 135–145)
TCO2: 21 mmol/L (ref 0–100)

## 2013-04-25 MED ORDER — ADENOSINE 6 MG/2ML IV SOLN
12.0000 mg | Freq: Once | INTRAVENOUS | Status: AC
  Administered 2013-04-25: 12 mg via INTRAVENOUS
  Filled 2013-04-25: qty 4

## 2013-04-25 MED ORDER — LORAZEPAM 2 MG/ML IJ SOLN
1.0000 mg | Freq: Once | INTRAMUSCULAR | Status: AC
  Administered 2013-04-25: 1 mg via INTRAVENOUS

## 2013-04-25 MED ORDER — LORAZEPAM 2 MG/ML IJ SOLN
INTRAMUSCULAR | Status: AC
  Filled 2013-04-25: qty 1

## 2013-04-25 MED ORDER — ADENOSINE 6 MG/2ML IV SOLN
INTRAVENOUS | Status: AC
  Filled 2013-04-25: qty 4

## 2013-04-25 MED ORDER — ADENOSINE 6 MG/2ML IV SOLN
12.0000 mg | Freq: Once | INTRAVENOUS | Status: AC
  Administered 2013-04-25: 12 mg via INTRAVENOUS

## 2013-04-25 MED ORDER — ADENOSINE 6 MG/2ML IV SOLN
INTRAVENOUS | Status: AC
  Administered 2013-04-25: 12 mg via INTRAVENOUS
  Filled 2013-04-25: qty 4

## 2013-04-25 NOTE — ED Provider Notes (Signed)
CSN: [id number]     Arrival date & time 04/25/13  1202 History  First MD Initiated Contact with Patient 04/25/13 1210     Chief Complaint  Patient presents with  . Chest Pain    Patient is a 22 y.o. female presenting with chest pain. The history is provided by the patient.  Chest Pain Pain location:  Substernal area Pain quality comment:  Pt feels like her heart his going to explode. Pain radiates to:  Does not radiate Pain radiates to the back: no   Onset quality:  Sudden Timing:  Constant Chronicity:  Recurrent (Pt had an episode of her heart racing in the past like this.  She takes metoprolol and sees Dr Jearld Pies.) Associated symptoms: shortness of breath   Associated symptoms: no fever and no nausea   Risk factors comment:  Hx of SVT Previously she had adenosine but she is very scare of how she felt and refuses to have that done again.  Past Medical History  Diagnosis Date  . UTI (lower urinary tract infection)   . Palpitations   . Tachypnea   . SVT (supraventricular tachycardia)     dx'd 12/17   History reviewed. No pertinent past surgical history. Family History  Problem Relation Age of Onset  . Heart Problems      cabg  . Heart Problems      cabg  . Hypertension Paternal Grandmother   . Diabetes Paternal Grandmother    History  Substance Use Topics  . Smoking status: Current Every Day Smoker -- 0.50 packs/day    Types: Cigarettes  . Smokeless tobacco: Not on file  . Alcohol Use: No   OB History   Grav Para Term Preterm Abortions TAB SAB Ect Mult Living                 Review of Systems  Constitutional: Negative for fever.  Respiratory: Positive for shortness of breath.   Cardiovascular: Positive for chest pain.  Gastrointestinal: Negative for nausea.  All other systems reviewed and are negative.    Allergies  Review of patient's allergies indicates no known allergies.  Home Medications   Current Outpatient Rx  Name  Route  Sig  Dispense  Refill   . metoprolol tartrate (LOPRESSOR) 25 MG tablet   Oral   Take 0.5 tablets (12.5 mg total) by mouth 2 (two) times daily. 1/2 tablet (total 12.5mg ) two times a day   30 tablet   1    BP 114/82  Pulse 186  Temp(Src) 98.7 F (37.1 C) (Oral)  Resp 21  Ht 5\' 6"  (1.676 m)  Wt 130 lb (58.968 kg)  BMI 20.99 kg/m2  SpO2 88% Physical Exam  Nursing note and vitals reviewed. Constitutional: She appears well-developed and well-nourished. She appears distressed.  HENT:  Head: Normocephalic and atraumatic.  Right Ear: External ear normal.  Left Ear: External ear normal.  Eyes: Conjunctivae are normal. Right eye exhibits no discharge. Left eye exhibits no discharge. No scleral icterus.  Neck: Neck supple. No tracheal deviation present.  Cardiovascular: Regular rhythm and intact distal pulses.  Tachycardia present.   Pulmonary/Chest: Effort normal and breath sounds normal. No stridor. No respiratory distress. She has no wheezes. She has no rales.  Abdominal: Soft. Bowel sounds are normal. She exhibits no distension. There is no tenderness. There is no rebound and no guarding.  Musculoskeletal: She exhibits no edema and no tenderness.  Neurological: She is alert. She has normal strength. No sensory deficit. Cranial  nerve deficit:  no gross defecits noted. She exhibits normal muscle tone. She displays no seizure activity. Coordination normal.  Skin: Skin is warm and dry. No rash noted.  Psychiatric: Her mood appears anxious.    ED Course  Procedures (including critical care time) Labs Review Labs Reviewed  POCT I-STAT, CHEM 8 - Abnormal; Notable for the following:    Potassium 3.4 (*)    Glucose, Bld 120 (*)    Hemoglobin 16.3 (*)    HCT 48.0 (*)    All other components within normal limits   Imaging Review No results found.  CRITICAL CARE Performed by: Celene Kras Total critical care time: 30 Critical care time was exclusive of separately billable procedures and treating other  patients. Critical care was necessary to treat or prevent imminent or life-threatening deterioration. Critical care was time spent personally by me on the following activities: development of treatment plan with patient and/or surrogate as well as nursing, discussions with consultants, evaluation of patient's response to treatment, examination of patient, obtaining history from patient or surrogate, ordering and performing treatments and interventions, ordering and review of laboratory studies, ordering and review of radiographic studies, pulse oximetry and re-evaluation of patient's condition.  EKG  Rate 243 Supraventricular tachycardia Rightward axis Septal infarct , age undetermined Marked ST abnormality, possible inferior subendocardial injury , likely rate related Abnormal ECG SVT is new since last tracing  2nd EKG post adenosine Rate 100 , sinus tachycardia Following right axis deviation Normal CT was  1227  Pt initially was refusing adenosine.  Stated she felt really bad previously and did not want it.  We discussed the options  1129  Temporary pause with the adenosine however immediately returned to SVT.  Pt is understandably anxious.  Will give another dose of adenosine and 1 mg ativan. 1236  Pt converted to sinus tach, rate 136 after the second dose of 12 mg adenosine.   MDM   1. SVT (supraventricular tachycardia)    Patient converted after 2 doses of adenosine. She was monitored in emergency department.  I discussed the case with Dr. Antoine Poche.  He thinks patient may benefit from a consultation with cardiac electrophysiologist.  Patient will follow up in the office. At this time no change in her medication regimen.    Celene Kras, MD 04/25/13 [phone]

## 2013-04-25 NOTE — ED Notes (Signed)
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 Reports SOB. Skin is warm and dry. Pt appears sleepy during triage.

## 2013-04-29 ENCOUNTER — Institutional Professional Consult (permissible substitution): Payer: BC Managed Care – PPO | Admitting: Internal Medicine

## 2013-05-08 ENCOUNTER — Encounter: Payer: BC Managed Care – PPO | Admitting: Physician Assistant

## 2013-05-21 ENCOUNTER — Ambulatory Visit (INDEPENDENT_AMBULATORY_CARE_PROVIDER_SITE_OTHER): Payer: BC Managed Care – PPO | Admitting: Internal Medicine

## 2013-05-21 ENCOUNTER — Encounter: Payer: Self-pay | Admitting: Internal Medicine

## 2013-05-21 VITALS — BP 108/76 | HR 86 | Ht 67.0 in | Wt 135.8 lb

## 2013-05-21 DIAGNOSIS — R002 Palpitations: Secondary | ICD-10-CM

## 2013-05-21 DIAGNOSIS — I471 Supraventricular tachycardia: Secondary | ICD-10-CM

## 2013-05-21 DIAGNOSIS — F17200 Nicotine dependence, unspecified, uncomplicated: Secondary | ICD-10-CM

## 2013-05-21 DIAGNOSIS — R0602 Shortness of breath: Secondary | ICD-10-CM

## 2013-05-21 DIAGNOSIS — I498 Other specified cardiac arrhythmias: Secondary | ICD-10-CM

## 2013-05-21 NOTE — Assessment & Plan Note (Signed)
She is disinclined to acquiesce to our request to stop smoking

## 2013-05-21 NOTE — Assessment & Plan Note (Signed)
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 We have discussed treatment options including using her medication when necessary catheter ablation. We discussed risks including but not limited to death and pacemaker implantation. At this juncture she would like to defer an invasive approach for her symptoms. She would also like to continue taking her beta blocker given the impact on the frequency of her events.  She is concerned however that may be aggravating her shortness of breath. We have suggested that she undertaking one week elevation trial to see what impact it has on her shortness of breath. Objective measurements today in the presence of a beta blocker failed to support a diagnosis of dysautonomia/POTS which were suggested by her symptoms.

## 2013-05-21 NOTE — Patient Instructions (Signed)
Your physician wants you to follow-up in: 6 months with Dr. Klein. You will receive a reminder letter in the mail two months in advance. If you don't receive a letter, please call our office to schedule the follow-up appointment.  Your physician recommends that you continue on your current medications as directed. Please refer to the Current Medication list given to you today.  

## 2013-05-21 NOTE — Assessment & Plan Note (Signed)
As above In the event that the symptoms persist, I think would be reasonable to consider pulmonary evaluation

## 2013-05-21 NOTE — Progress Notes (Signed)
ELECTROPHYSIOLOGY CONSULT NOTE  Patient ID: Angela Guzman, MRN: [id number], DOB/AGE: [birth year] 22 y.o. Admit date: (Not on file) Date of Consult: 05/21/2013  Primary Physician: Nadean Corwin, MD Primary Cardiologist: new  Chief Complaint:  SVT   HPI Angela Guzman is a 22 y.o. female   Seen at the request of the emergency room because of recurrent abrupt onset and offset tachypalpitations. These are associated with some lightheadedness and shortness of breath. There is no syncope. They're frog negative and diuretic negative. She was seen previously and was started on beta blocker has had a major impact on the frequency decreasing from 1-2/month--1-2/year. These episodes almost all require emergency room visits. He failed to respond to Valsalva maneuvers.  She also has a a different palpitations syndrome wherein she feels her heart rate is pounding in the 100 beats per minute range. This can be accompanied by some shortness of breath. When she stop her beta blocker for a couple of days it seemed to get worse. It has never been clearly identified.  Echocardiogram 1/14 demonstrated normal left ventricular function without evidence of AV valvular abnormalities      Past Medical History  Diagnosis Date  . UTI (lower urinary tract infection)   . Palpitations   . Tachypnea   . SVT (supraventricular tachycardia)     dx'd 12/17  . Allergic rhinitis       Surgical History: No past surgical history on file.   Home Meds: Prior to Admission medications   Medication Sig Start Date End Date Taking? Authorizing Provider  metoprolol tartrate (LOPRESSOR) 25 MG tablet Take 0.5 tablets (12.5 mg total) by mouth 2 (two) times daily. 1/2 tablet (total 12.5mg ) two times a day 04/24/13  Yes Laurey Morale, MD      Allergies: No Known Allergies  History   Social History  . Marital Status: Single    Spouse Name: N/A    Number of Children: 1  . Years of Education: N/A    Occupational History  .     Social History Main Topics  . Smoking status: Current Every Day Smoker -- 0.50 packs/day    Types: Cigarettes  . Smokeless tobacco: Not on file  . Alcohol Use: No  . Drug Use: No  . Sexual Activity:    Other Topics Concern  . Not on file   Social History Narrative  . No narrative on file     Family History  Problem Relation Age of Onset  . Heart Problems      cabg  . Heart Problems      cabg  . Hypertension Paternal Grandmother   . Diabetes Paternal Grandmother      ROS:  Please see the history of present illness.     All other systems reviewed and negative.    Physical Exam:*  BP 108/76  Pulse 86  Ht 5\' 7"  (1.702 m)  Wt 135 lb 12.8 oz (61.598 kg)  BMI 21.26 kg/m2  Alert and oriented in no acute distress HENT- normal Eyes- EOMI, without scleral icterus Skin- warm and dry; without rashes LN-neg Neck- supple without thyromegaly, JVP-flat, carotids brisk and full without bruits Back-without CVAT or kyphosis Lungs-clear to auscultation CV-Regular rate and rhythm, nl S1 and S2, no murmurs gallops or rubs, S4-absent Abd-soft with active bowel sounds; no midline pulsation or hepatomegaly Pulses-intact femoral and distal MKS-without gross deformity Neuro- Ax O, CN3-12 intact, grossly normal motor and sensory function Affect engaging     Labs:  Cardiac Enzymes No results found for this basename: CKTOTAL, CKMB, TROPONINI,  in the last 72 hours CBC Lab Results  Component Value Date   WBC 5.6 08/03/2012   HGB 16.3* 04/25/2013   HCT 48.0* 04/25/2013   MCV 91.9 08/03/2012   PLT 157 08/03/2012   PROTIME: No results found for this basename: LABPROT, INR,  in the last 72 hours Chemistry No results found for this basename: NA, K, CL, CO2, BUN, CREATININE, CALCIUM, LABALBU, PROT, BILITOT, ALKPHOS, ALT, AST, GLUCOSE,  in the last 168 hours Lipids No results found for this basename: CHOL,  HDL,  LDLCALC,  TRIG   BNP No results found for  this basename: probnp   Miscellaneous Lab Results  Component Value Date   DDIMER  Value: <0.22        AT THE INHOUSE ESTABLISHED CUTOFF VALUE OF 0.48 ug/mL FEU, THIS ASSAY HAS BEEN DOCUMENTED IN THE LITERATURE TO HAVE A SENSITIVITY AND NEGATIVE PREDICTIVE VALUE OF AT LEAST 98 TO 99%.  THE TEST RESULT SHOULD BE CORRELATED WITH AN ASSESSMENT OF THE CLINICAL PROBABILITY OF DVT / VTE. 12/21/2010    Radiology/Studies:  No results found.  EKG ECG 9 October 14 demonstrated sinus rhythm at 11 without evidence of preexcitation and with no evidence of an R. prime in lead V1.  ECG 9 October 14 30 minutes prior demonstrates a narrow QRS tachycardia at a cycle length of 250 ms. A distinct R. prime is noted in lead V1  Assessment and Plan:    Sherryl Manges

## 2013-05-21 NOTE — Assessment & Plan Note (Signed)
As above.

## 2013-05-23 ENCOUNTER — Other Ambulatory Visit: Payer: Self-pay

## 2013-05-31 ENCOUNTER — Ambulatory Visit: Payer: Self-pay | Admitting: Cardiology

## 2013-06-04 ENCOUNTER — Ambulatory Visit: Payer: BC Managed Care – PPO | Admitting: Physician Assistant

## 2013-06-04 ENCOUNTER — Encounter: Payer: Self-pay | Admitting: Physician Assistant

## 2013-06-04 VITALS — BP 110/68 | HR 80 | Temp 98.4°F | Resp 16 | Wt 137.0 lb

## 2013-06-04 DIAGNOSIS — I471 Supraventricular tachycardia: Secondary | ICD-10-CM

## 2013-06-04 DIAGNOSIS — R202 Paresthesia of skin: Secondary | ICD-10-CM

## 2013-06-04 DIAGNOSIS — Z23 Encounter for immunization: Secondary | ICD-10-CM

## 2013-06-04 LAB — HEMOGLOBIN A1C
Hgb A1c MFr Bld: 5 % (ref <5.7)
Mean Plasma Glucose: 97 mg/dL (ref <117)

## 2013-06-04 LAB — CBC WITH DIFFERENTIAL/PLATELET
Basophils Absolute: 0.1 10*3/uL (ref 0.0–0.1)
Lymphocytes Relative: 40 % (ref 12–46)
Neutro Abs: 3.7 10*3/uL (ref 1.7–7.7)
Platelets: 214 10*3/uL (ref 150–400)
RDW: 13.2 % (ref 11.5–15.5)
WBC: 7 10*3/uL (ref 4.0–10.5)

## 2013-06-04 MED ORDER — VERAPAMIL HCL 80 MG PO TABS: 80.0000 mg | ORAL_TABLET | Freq: Two times a day (BID) | ORAL | Status: DC

## 2013-06-04 NOTE — Progress Notes (Signed)
  Subjective:    Patient ID: Angela Guzman, female    DOB: 01-18-1991, 22 y.o.   MRN: [id number]  HPI Patient's heart doctor told her to get off the metoprolol 2-3 weeks ago secondary to short of breath. Patient was sitting on the couch watching a movie when her hands felt numb with tingling in her finger tips and her head felt numb, and a lot of pressure. Patient states heart was beating fast at the time but due to anxiety. Then this morning felt fine when she first woke up, then you got to work and her coworkers told her she looked red and purple, and when she first clocked in she noticed that she felt numb all over and could not "feel her body." Denies tingling, temperature difference. Denies vision/speech changes, SOB, CP, bladder/bowel problems.   Current Outpatient Prescriptions on File Prior to Visit  Medication Sig Dispense Refill  . metoprolol tartrate (LOPRESSOR) 25 MG tablet Take 0.5 tablets (12.5 mg total) by mouth 2 (two) times daily. 1/2 tablet (total 12.5mg ) two times a day  30 tablet  1   No current facility-administered medications on file prior to visit.   Past Medical History  Diagnosis Date  . UTI (lower urinary tract infection)   . Palpitations   . Tachypnea   . SVT (supraventricular tachycardia)     dx'd 12/17  . Allergic rhinitis     Review of Systems  Constitutional: Negative.   HENT: Negative.   Cardiovascular: Negative.   Gastrointestinal: Negative.   Genitourinary: Negative.   Musculoskeletal: Negative.   Skin: Negative.   Neurological: Positive for numbness. Negative for dizziness, tremors, seizures, syncope, facial asymmetry, speech difficulty, weakness, light-headedness and headaches.       Objective:   Physical Exam  Constitutional: She appears well-developed and well-nourished.  HENT:  Head: Normocephalic and atraumatic.  Eyes: Conjunctivae are normal. Pupils are equal, round, and reactive to light.  Neck: Normal range of motion. Neck supple.   Cardiovascular: Regular rhythm and intact distal pulses.  Tachycardia present.   Pulmonary/Chest: Effort normal and breath sounds normal.       Assessment & Plan:  1. SVT (supraventricular tachycardia) - verapamil (CALAN) 80 MG tablet; Take 1 tablet (80 mg total) by mouth 2 (two) times daily.  Dispense: 60 tablet; Refill: 0 - TSH  2. Paresthesia - CBC with Differential - BASIC METABOLIC PANEL WITH GFR - Hepatic function panel - Hemoglobin A1c - Vitamin B12

## 2013-06-04 NOTE — Patient Instructions (Signed)
Supraventricular Tachycardia °Supraventricular tachycardia (SVT) is an abnormal heart rhythm (arrhythmia) that causes the heart to beat very fast (tachycardia). This kind of fast heartbeat originates in the upper chambers of the heart (atria). SVT can cause the heart to beat greater than 100 beats per minute. SVT can have a rapid burst of heartbeats. This can start and stop suddenly without warning and is called nonsustained. SVT can also be sustained, in which the heart beats at a continuous fast rate.  °CAUSES  °There can be different causes of SVT. Some of these include: °· Heart valve problems such as mitral valve prolapse. °· An enlarged heart (hypertrophic cardiomyopathy). °· Congenital heart problems. °· Heart inflammation (pericarditis). °· Hyperthyroidism. °· Low potassium or magnesium levels. °· Caffeine. °· Drug use such as cocaine, methamphetamines, or stimulants. °· Some over-the-counter medicines such as: °· Decongestants. °· Diet medicines. °· Herbal medicines. °SYMPTOMS  °Symptoms of SVT can vary. Symptoms depend on whether the SVT is sustained or nonsustained. You may experience: °· No symptoms (asymptomatic). °· An awareness of your heart beating rapidly (palpitations). °· Shortness of breath. °· Chest pain or pressure. °If your blood pressure drops because of the SVT, you may experience: °· Fainting or near fainting. °· Weakness. °· Dizziness. °DIAGNOSIS  °Different tests can be performed to diagnose SVT, such as: °· An electrocardiogram (EKG). This is a painless test that records the electrical activity of your heart. °· Holter monitor. This is a 24 hour recording of your heart rhythm. You will be given a diary. Write down all symptoms that you have and what you were doing at the time you experienced symptoms. °· Arrhythmia monitor. This is a small device that your wear for several weeks. It records the heart rhythm when you have symptoms. °· Echocardiogram. This is an imaging test to help detect  abnormal heart structure such as congenital abnormalities, heart valve problems, or heart enlargement. °· Stress test. This test can help determine if the SVT is related to exercise. °· Electrophysiology study (EPS). This is a procedure that evaluates your heart's electrical system and can help your caregiver find the cause of your SVT. °TREATMENT  °Treatment of SVT depends on the symptoms, how often it recurs, and whether there are any underlying heart problems.  °· If symptoms are rare and no other cardiac disease is present, no treatment may be needed. °· Blood work may be done to check potassium, magnesium, and thyroid hormone levels to see if they are abnormal. If these levels are abnormal, treatment to correct the problems will occur. °Medicines °Your caregiver may use oral medicines to treat SVT. These medicines are given for long-term control of SVT. Medicines may be used alone or in combination with other treatments. These medicines work to slow nerve impulses in the heart muscle. These medicines can also be used to treat high blood pressure. Some of these medicines may include: °· Calcium channel blockers. °· Beta blockers. °· Digoxin. °Nonsurgical procedures °Nonsurgical techniques may be used if oral medicines do not work. Some examples include: °· Cardioversion. This technique uses either drugs or an electrical shock to restore a normal heart rhythm. °· Cardioversion drugs may be given through an intravenous (IV) line to help "reset" the heart rhythm. °· In electrical cardioversion, the caregiver shocks your heart to stop its beat for a split second. This helps to reset the heart to a normal rhythm. °· Ablation. This procedure is done under mild sedation. High frequency radio wave energy is used to   destroy the area of heart tissue responsible for the SVT. °HOME CARE INSTRUCTIONS  °· Do not smoke. °· Only take medicines prescribed by your caregiver. Check with your caregiver before using over-the-counter  medicines. °· Check with your caregiver about how much alcohol and caffeine (coffee, tea, colas, or chocolate) you may have. °· It is very important to keep all follow-up referrals and appointments in order to properly manage this problem. °SEEK IMMEDIATE MEDICAL CARE IF: °· You have dizziness. °· You faint or nearly faint. °· You have shortness of breath. °· You have chest pain or pressure. °· You have sudden nausea or vomiting. °· You have profuse sweating. °· You are concerned about how long your symptoms last. °· You are concerned about the frequency of your SVT episodes. °If you have the above symptoms, call your local emergency services (911 in [place] immediately. Do not drive yourself to the hospital. °MAKE SURE YOU:  °· Understand these instructions. °· Will watch your condition. °· Will get help right away if you are not doing well or get worse. °Document Released: 07/04/2005 Document Revised: 09/26/2011 Document Reviewed: 10/16/2008 °[employer]® Patient Information ©2014 [employer]. ° °

## 2013-06-05 LAB — HEPATIC FUNCTION PANEL
Albumin: 4.6 g/dL (ref 3.5–5.2)
Alkaline Phosphatase: 47 U/L (ref 39–117)
Total Bilirubin: 0.7 mg/dL (ref 0.3–1.2)

## 2013-06-05 LAB — BASIC METABOLIC PANEL WITH GFR
Chloride: 101 mEq/L (ref 96–112)
Creat: 0.8 mg/dL (ref 0.50–1.10)
GFR, Est African American: >89 mL/min
GFR, Est Non African American: >89 mL/min
Potassium: 4.1 mEq/L (ref 3.5–5.3)

## 2013-06-05 LAB — VITAMIN B12: Vitamin B-12: 379 pg/mL (ref 211–911)

## 2013-07-03 ENCOUNTER — Encounter: Payer: Self-pay | Admitting: Internal Medicine

## 2013-07-04 ENCOUNTER — Ambulatory Visit: Payer: Self-pay | Admitting: Physician Assistant

## 2013-07-23 ENCOUNTER — Ambulatory Visit: Payer: Self-pay | Admitting: Cardiology

## 2013-07-26 ENCOUNTER — Encounter (HOSPITAL_COMMUNITY): Payer: Self-pay | Admitting: Emergency Medicine

## 2013-07-26 DIAGNOSIS — R51 Headache: Secondary | ICD-10-CM | POA: Insufficient documentation

## 2013-07-26 DIAGNOSIS — F17200 Nicotine dependence, unspecified, uncomplicated: Secondary | ICD-10-CM | POA: Insufficient documentation

## 2013-07-26 NOTE — ED Notes (Signed)
Pt. reports persistent headache " pressure/ feels hot" with nausea and lightheaded for 2 weeks unrelieved by OTC pain medications . Denies injury .

## 2013-07-27 ENCOUNTER — Emergency Department (HOSPITAL_COMMUNITY)
Admission: EM | Admit: 2013-07-27 | Discharge: 2013-07-27 | Discharge status: Against Medical Advice | Payer: BC Managed Care – PPO | Attending: Emergency Medicine | Admitting: Emergency Medicine

## 2013-07-27 NOTE — ED Notes (Signed)
Pt did not answer when called

## 2013-09-06 ENCOUNTER — Encounter: Payer: Self-pay | Admitting: Cardiology

## 2013-11-03 ENCOUNTER — Encounter (HOSPITAL_COMMUNITY): Payer: Self-pay | Admitting: Emergency Medicine

## 2013-11-03 ENCOUNTER — Emergency Department (HOSPITAL_COMMUNITY): Payer: BC Managed Care – PPO

## 2013-11-03 ENCOUNTER — Emergency Department (HOSPITAL_COMMUNITY)
Admission: EM | Admit: 2013-11-03 | Discharge: 2013-11-03 | Disposition: A | Discharge status: Home / Self Care | Payer: BC Managed Care – PPO | Attending: Emergency Medicine | Admitting: Emergency Medicine

## 2013-11-03 DIAGNOSIS — R000 Tachycardia, unspecified: Secondary | ICD-10-CM | POA: Insufficient documentation

## 2013-11-03 DIAGNOSIS — Z87440 Personal history of urinary (tract) infections: Secondary | ICD-10-CM | POA: Insufficient documentation

## 2013-11-03 DIAGNOSIS — R002 Palpitations: Secondary | ICD-10-CM | POA: Insufficient documentation

## 2013-11-03 DIAGNOSIS — I498 Other specified cardiac arrhythmias: Secondary | ICD-10-CM | POA: Insufficient documentation

## 2013-11-03 DIAGNOSIS — F17200 Nicotine dependence, unspecified, uncomplicated: Secondary | ICD-10-CM | POA: Insufficient documentation

## 2013-11-03 DIAGNOSIS — J309 Allergic rhinitis, unspecified: Secondary | ICD-10-CM | POA: Insufficient documentation

## 2013-11-03 LAB — I-STAT CHEM 8, ED
BUN: 13 mg/dL (ref 6–23)
CHLORIDE: 104 meq/L (ref 96–112)
Calcium, Ion: 1.21 mmol/L (ref 1.12–1.23)
Creatinine, Ser: 1 mg/dL (ref 0.50–1.10)
Glucose, Bld: 81 mg/dL (ref 70–99)
HCT: 49 % — ABNORMAL HIGH (ref 36.0–46.0)
Hemoglobin: 16.7 g/dL — ABNORMAL HIGH (ref 12.0–15.0)
Potassium: 3.6 mEq/L — ABNORMAL LOW (ref 3.7–5.3)
SODIUM: 141 meq/L (ref 137–147)
TCO2: 27 mmol/L (ref 0–100)

## 2013-11-03 LAB — I-STAT TROPONIN, ED: TROPONIN I, POC: 0.06 ng/mL (ref 0.00–0.08)

## 2013-11-03 MED ORDER — LORAZEPAM 1 MG PO TABS
0.5000 mg | ORAL_TABLET | Freq: Once | ORAL | Status: AC
  Administered 2013-11-03: 0.5 mg via ORAL
  Filled 2013-11-03: qty 1

## 2013-11-03 NOTE — ED Notes (Signed)
Pt arrived by [facility] from home. Pt was getting out of the shower this about 1420 and had a sudden onset of chest pressure while bending over to pick up towel. Pt has hx of SVT. HR 114. While EMS auscultating pt hr sounded irregular. 12lead Sinus Tach. Pt initial pain 10/10 when EMS arrived 8/10. EMS administered ASA 324mg  and Nitro x2. Pt is currently pain free at this time but stated that she feels "really numb".

## 2013-11-03 NOTE — ED Provider Notes (Signed)
CSN: [id number]     Arrival date & time 11/03/13  1726 History   First MD Initiated Contact with Patient 11/03/13 1744     Chief Complaint  Patient presents with  . Chest Pain  . Tachycardia     (Consider location/radiation/quality/duration/timing/severity/associated sxs/prior Treatment) HPI  Angela Guzman Is a 23 year old female with a past medical history of supraventricular tachycardia who presents the emergency department chief complaint of racing heart and chest discomfort. The patient states that today around 220 she was getting out of the shower she reached out to grab her palate she had sudden onset of racing heart, palpitations, chest pressure and chest pain.  She states that  her pain was severe,, she felt relief with 2 sublingual nitros given by EMS along with her 24 of aspirin.  She is a daily smoker he is a family history of early heart attack. Patient denies any caffeine use, cocaine or other stimulant abuse.  She denies a history of anxiety.  Patient denies any history of feelings of infection such as urinary tract symptoms, abdominal pain, fever, symptoms of URI or skin infection. Past Medical History  Diagnosis Date  . UTI (lower urinary tract infection)   . Palpitations   . Tachypnea   . SVT (supraventricular tachycardia)     dx'd 12/17  . Allergic rhinitis    History reviewed. No pertinent past surgical history. Family History  Problem Relation Age of Onset  . Heart Problems      cabg  . Heart Problems      cabg  . Hypertension Paternal Grandmother   . Diabetes Paternal Grandmother    History  Substance Use Topics  . Smoking status: Current Every Day Smoker -- 0.50 packs/day    Types: Cigarettes  . Smokeless tobacco: Not on file  . Alcohol Use: Yes     Comment: occasionally   OB History   Grav Para Term Preterm Abortions TAB SAB Ect Mult Living                 Review of Systems  Ten systems reviewed and are negative for acute change, except as  noted in the HPI.    Allergies  Review of patient's allergies indicates no known allergies.  Home Medications   Prior to Admission medications   Not on File   BP 106/72  Pulse 105  Temp(Src) 98.3 F (36.8 C) (Oral)  Resp 15  SpO2 99%  LMP 10/27/2013 Physical Exam Physical Exam  Nursing note and vitals reviewed. Constitutional: She is oriented to person, place, and time. She appears well-developed and well-nourished. No distress.  HENT:  Head: Normocephalic and atraumatic.  Eyes: Conjunctivae normal and EOM are normal. Pupils are equal, round, and reactive to light. No scleral icterus.  Neck: Normal range of motion.  Cardiovascular: Tachycardic regular rhythm and normal heart sounds.  Exam reveals no gallop and no friction rub.   No murmur heard. Pulmonary/Chest: Effort normal and breath sounds normal. No respiratory distress.  Abdominal: Soft. Bowel sounds are normal. She exhibits no distension and no mass. There is no tenderness. There is no guarding.  Neurological: She is alert and oriented to person, place, and time.  Skin: Skin is warm and dry. She is not diaphoretic.    ED Course  Procedures (including critical care time) Labs Review Labs Reviewed - No data to display  Imaging Review No results found.   EKG Interpretation   Date/Time:  Sunday November 03 2013 17:37:58 EDT Ventricular  Rate:  106 PR Interval:  146 QRS Duration: 84 QT Interval:  342 QTC Calculation: 454 R Axis:   96 Text Interpretation:  Sinus tachycardia Borderline right axis deviation No  significant change since last tracing Confirmed by Maryan Rued  MD, Loree Fee  [phone]) on 11/03/2013 5:44:41 PM      MDM   Final diagnoses:  None   Patient with sinus tachycardia, presented SVT.  She denies any pleuritic chest pain.  Filed Vitals:   11/03/13 2045 11/03/13 2100 11/03/13 2106 11/03/13 2126  BP: 106/72 111/66 115/67 122/66  Pulse: 86 85 85   Temp:    98.5 F (36.9 C)  TempSrc:   Oral  Oral  Resp:   18 16  SpO2: 98% 98% 99% 99%  Patient tachycardia resolved with ativan. No suspicion for PE, ACS, or other emergent cause of tachycardia and chest oaun; SXS are resolved and the patient is requesting to leave. .The patient appears reasonably screened and/or stabilized for discharge and I doubt any other medical condition or other [facility] requiring further screening, evaluation, or treatment in the ED at this time prior to discharge. Return precautions discussed  Margarita Mail, PA-C 11/04/13 [phone]

## 2013-11-03 NOTE — Discharge Instructions (Signed)
Your caregiver has diagnosed you as having chest pain that is not specific for one problem, but does not require admission.  You are at low risk for an acute heart condition or other serious illness. Chest pain comes from many different causes.  SEEK IMMEDIATE MEDICAL ATTENTION IF: You have severe chest pain, especially if the pain is crushing or pressure-like and spreads to the arms, back, neck, or jaw, or if you have sweating, nausea (feeling sick to your stomach), or shortness of breath. THIS IS AN EMERGENCY. Don't wait to see if the pain will go away. Get medical help at once. Call 911 or 0 (operator). DO NOT drive yourself to the hospital.  Your chest pain gets worse and does not go away with rest.  You have an attack of chest pain lasting longer than usual, despite rest and treatment with the medications your caregiver has prescribed.  You wake from sleep with chest pain or shortness of breath.  You feel dizzy or faint.  You have chest pain not typical of your usual pain for which you originally saw your caregiver.    Nonspecific Tachycardia Tachycardia is a faster than normal heartbeat (more than 100 beats per minute). In adults, the heart normally beats between 60 and 100 times a minute. A fast heartbeat may be a normal response to exercise or stress. It does not necessarily mean that something is wrong. However, sometimes when your heart beats too fast it may not be able to pump enough blood to the rest of your body. This can result in chest pain, shortness of breath, dizziness, and even fainting. Nonspecific tachycardia means that the specific cause or pattern of your tachycardia is unknown. CAUSES  Tachycardia may be harmless or it may be due to a more serious underlying cause. Possible causes of tachycardia include:  Exercise or exertion.  Fever.  Pain or injury.  Infection.  Loss of body fluids (dehydration).  Overactive thyroid.  Lack of red blood cells (anemia).  Anxiety  and stress.  Alcohol.  Caffeine.  Tobacco products.  Diet pills.  Illegal drugs.  Heart disease. SYMPTOMS  Rapid or irregular heartbeat (palpitations).  Suddenly feeling your heart beating (cardiac awareness).  Dizziness.  Tiredness (fatigue).  Shortness of breath.  Chest pain.  Nausea.  Fainting. DIAGNOSIS  Your caregiver will perform a physical exam and take your medical history. In some cases, a heart specialist (cardiologist) may be consulted. Your caregiver may also order:  Blood tests.  Electrocardiography. This test records the electrical activity of your heart.  A heart monitoring test. TREATMENT  Treatment will depend on the likely cause of your tachycardia. The goal is to treat the underlying cause of your tachycardia. Treatment methods may include:  Replacement of fluids or blood through an intravenous (IV) tube for moderate to severe dehydration or anemia.  New medicines or changes in your current medicines.  Diet and lifestyle changes.  Treatment for certain infections.  Stress relief or relaxation methods. HOME CARE INSTRUCTIONS   Rest.  Drink enough fluids to keep your urine clear or pale yellow.  Do not smoke.  Avoid:  Caffeine.  Tobacco.  Alcohol.  Chocolate.  Stimulants such as over-the-counter diet pills or pills that help you stay awake.  Situations that cause anxiety or stress.  Illegal drugs such as marijuana, phencyclidine (PCP), and cocaine.  Only take medicine as directed by your caregiver.  Keep all follow-up appointments as directed by your caregiver. SEEK IMMEDIATE MEDICAL CARE IF:  You have pain in your chest, upper arms, jaw, or neck.  You become weak, dizzy, or feel faint.  You have palpitations that will not go away.  You vomit, have diarrhea, or pass blood in your stool.  Your skin is cool, pale, and wet.  You have a fever that will not go away with rest, fluids, and medicine. MAKE SURE YOU:     Understand these instructions.  Will watch your condition.  Will get help right away if you are not doing well or get worse. Document Released: 08/11/2004 Document Revised: 09/26/2011 Document Reviewed: 06/14/2011 [facility] Patient Information 2014 [place], [place].

## 2013-11-06 NOTE — ED Provider Notes (Signed)
Medical screening examination/treatment/procedure(s) were performed by non-physician practitioner and as supervising physician I was immediately available for consultation/collaboration.   EKG Interpretation   Date/Time:  Sunday November 03 2013 17:37:58 EDT Ventricular Rate:  106 PR Interval:  146 QRS Duration: 84 QT Interval:  342 QTC Calculation: 454 R Axis:   96 Text Interpretation:  Sinus tachycardia Borderline right axis deviation No  significant change since last tracing Confirmed by Maryan Rued  MD, Loree Fee  [phone]) on 11/03/2013 5:44:41 PM        Blanchie Dessert, MD 11/06/13 1530

## 2014-03-11 ENCOUNTER — Emergency Department (HOSPITAL_COMMUNITY): Payer: Medicaid Other

## 2014-03-11 ENCOUNTER — Emergency Department (HOSPITAL_COMMUNITY)
Admission: EM | Admit: 2014-03-11 | Discharge: 2014-03-11 | Disposition: A | Discharge status: Home / Self Care | Payer: Medicaid Other | Attending: Emergency Medicine | Admitting: Emergency Medicine

## 2014-03-11 ENCOUNTER — Encounter (HOSPITAL_COMMUNITY): Payer: Self-pay | Admitting: Emergency Medicine

## 2014-03-11 DIAGNOSIS — Z8709 Personal history of other diseases of the respiratory system: Secondary | ICD-10-CM | POA: Insufficient documentation

## 2014-03-11 DIAGNOSIS — O9989 Other specified diseases and conditions complicating pregnancy, childbirth and the puerperium: Secondary | ICD-10-CM | POA: Insufficient documentation

## 2014-03-11 DIAGNOSIS — O99330 Smoking (tobacco) complicating pregnancy, unspecified trimester: Secondary | ICD-10-CM | POA: Insufficient documentation

## 2014-03-11 DIAGNOSIS — Z87440 Personal history of urinary (tract) infections: Secondary | ICD-10-CM | POA: Diagnosis not present

## 2014-03-11 DIAGNOSIS — N898 Other specified noninflammatory disorders of vagina: Secondary | ICD-10-CM | POA: Diagnosis not present

## 2014-03-11 DIAGNOSIS — K5900 Constipation, unspecified: Secondary | ICD-10-CM | POA: Insufficient documentation

## 2014-03-11 DIAGNOSIS — Z3490 Encounter for supervision of normal pregnancy, unspecified, unspecified trimester: Secondary | ICD-10-CM

## 2014-03-11 DIAGNOSIS — R110 Nausea: Secondary | ICD-10-CM | POA: Insufficient documentation

## 2014-03-11 LAB — COMPREHENSIVE METABOLIC PANEL
ALT: 12 U/L (ref 0–35)
AST: 18 U/L (ref 0–37)
Albumin: 3.5 g/dL (ref 3.5–5.2)
Alkaline Phosphatase: 42 U/L (ref 39–117)
Anion gap: 12 (ref 5–15)
BUN: 11 mg/dL (ref 6–23)
CALCIUM: 8.7 mg/dL (ref 8.4–10.5)
CO2: 20 mEq/L (ref 19–32)
CREATININE: 0.68 mg/dL (ref 0.50–1.10)
Chloride: 102 mEq/L (ref 96–112)
GFR calc non Af Amer: >90 mL/min (ref >90)
GLUCOSE: 87 mg/dL (ref 70–99)
Potassium: 3.8 mEq/L (ref 3.7–5.3)
SODIUM: 134 meq/L — AB (ref 137–147)
Total Bilirubin: <0.2 mg/dL — ABNORMAL LOW (ref 0.3–1.2)
Total Protein: 6.4 g/dL (ref 6.0–8.3)

## 2014-03-11 LAB — CBC WITH DIFFERENTIAL/PLATELET
Basophils Absolute: 0 10*3/uL (ref 0.0–0.1)
Basophils Relative: 0 % (ref 0–1)
EOS ABS: 0.1 10*3/uL (ref 0.0–0.7)
Eosinophils Relative: 1 % (ref 0–5)
HCT: 35.4 % — ABNORMAL LOW (ref 36.0–46.0)
HEMOGLOBIN: 12.7 g/dL (ref 12.0–15.0)
LYMPHS ABS: 1.9 10*3/uL (ref 0.7–4.0)
LYMPHS PCT: 25 % (ref 12–46)
MCH: 32.9 pg (ref 26.0–34.0)
MCHC: 35.9 g/dL (ref 30.0–36.0)
MCV: 91.7 fL (ref 78.0–100.0)
MONOS PCT: 5 % (ref 3–12)
Monocytes Absolute: 0.4 10*3/uL (ref 0.1–1.0)
NEUTROS PCT: 69 % (ref 43–77)
Neutro Abs: 5.3 10*3/uL (ref 1.7–7.7)
Platelets: 151 10*3/uL (ref 150–400)
RBC: 3.86 MIL/uL — AB (ref 3.87–5.11)
RDW: 12 % (ref 11.5–15.5)
WBC: 7.6 10*3/uL (ref 4.0–10.5)

## 2014-03-11 LAB — HCG, QUANTITATIVE, PREGNANCY: hCG, Beta Chain, Quant, S: 192201 m[IU]/mL — ABNORMAL HIGH (ref <5)

## 2014-03-11 LAB — URINALYSIS, ROUTINE W REFLEX MICROSCOPIC
Bilirubin Urine: NEGATIVE
Glucose, UA: NEGATIVE mg/dL
Hgb urine dipstick: NEGATIVE
Ketones, ur: NEGATIVE mg/dL
NITRITE: NEGATIVE
Protein, ur: NEGATIVE mg/dL
SPECIFIC GRAVITY, URINE: 1.034 — AB (ref 1.005–1.030)
UROBILINOGEN UA: 0.2 mg/dL (ref 0.0–1.0)
pH: 5.5 (ref 5.0–8.0)

## 2014-03-11 LAB — URINE MICROSCOPIC-ADD ON

## 2014-03-11 LAB — WET PREP, GENITAL
Clue Cells Wet Prep HPF POC: NONE SEEN
Trich, Wet Prep: NONE SEEN
WBC WET PREP: NONE SEEN
YEAST WET PREP: NONE SEEN

## 2014-03-11 LAB — ABO/RH: ABO/RH(D): O POS

## 2014-03-11 LAB — POC URINE PREG, ED: PREG TEST UR: POSITIVE — AB

## 2014-03-11 MED ORDER — DOCUSATE SODIUM 100 MG PO CAPS: 100.0000 mg | ORAL_CAPSULE | Freq: Two times a day (BID) | ORAL | Status: DC

## 2014-03-11 MED ORDER — PRENATAL COMPLETE 14-0.4 MG PO TABS: 1.0000 | ORAL_TABLET | Freq: Every day | ORAL | Status: DC

## 2014-03-11 NOTE — ED Provider Notes (Signed)
CSN: [id number]     Arrival date & time 03/11/14  1345 History  This chart was scribed for non-physician practitioner, Cleatrice Burke, PA-C working with Richarda Blade, MD by Frederich Balding, ED scribe. This patient was seen in room C26C/C26C and the patient's care was started at 5:24 PM.   Chief Complaint  Patient presents with  . Abdominal Pain  . Constipation   The history is provided by the patient. No language interpreter was used.   HPI Comments: Angela Guzman is a 23 y.o. female who presents to the Emergency Department complaining of constipation that started one month ago. Pt reports a very small bowel movement this morning but states that is the first one if 4 weeks. She states that her abdomen is "swollen". Also reports nausea and vaginal discharge. Denies fever, chills, abdominal pain, emesis, vaginal bleeding. She has been pregnant once and had one child 5 years ago. Pt LMP was 01/15/2014.   Past Medical History  Diagnosis Date  . UTI (lower urinary tract infection)   . Palpitations   . Tachypnea   . SVT (supraventricular tachycardia)     dx'd 12/17  . Allergic rhinitis    History reviewed. No pertinent past surgical history. Family History  Problem Relation Age of Onset  . Heart Problems      cabg  . Heart Problems      cabg  . Hypertension Paternal Grandmother   . Diabetes Paternal Grandmother    History  Substance Use Topics  . Smoking status: Current Every Day Smoker -- 0.50 packs/day    Types: Cigarettes  . Smokeless tobacco: Not on file  . Alcohol Use: Yes     Comment: occasionally   OB History   Grav Para Term Preterm Abortions TAB SAB Ect Mult Living                 Review of Systems  Constitutional: Negative for fever and chills.  Gastrointestinal: Positive for nausea and constipation. Negative for vomiting and abdominal pain.  Genitourinary: Positive for vaginal discharge. Negative for vaginal bleeding.  All other systems reviewed and are  negative.  Allergies  Review of patient's allergies indicates no known allergies.  Home Medications   Prior to Admission medications   Not on File   BP 119/73  Pulse 76  Temp(Src) 98.2 F (36.8 C) (Oral)  Resp 18  SpO2 100%  Physical Exam  Nursing note and vitals reviewed. Constitutional: She is oriented to person, place, and time. She appears well-developed and well-nourished. No distress.  Well appearing, no distress  HENT:  Head: Normocephalic and atraumatic.  Right Ear: External ear normal.  Left Ear: External ear normal.  Nose: Nose normal.  Mouth/Throat: Oropharynx is clear and moist.  Eyes: Conjunctivae are normal.  Neck: Normal range of motion.  Cardiovascular: Normal rate, regular rhythm and normal heart sounds.   Pulmonary/Chest: Effort normal and breath sounds normal. No stridor. No respiratory distress. She has no wheezes. She has no rales.  Abdominal: Soft. Bowel sounds are normal. She exhibits no distension. There is no tenderness.  Genitourinary: There is no rash, tenderness, lesion or injury on the right labia. There is no rash, tenderness, lesion or injury on the left labia. Cervix exhibits no motion tenderness, no discharge and no friability. Right adnexum displays no mass, no tenderness and no fullness. Left adnexum displays no mass, no tenderness and no fullness. No erythema, tenderness or bleeding around the vagina. No foreign body around the vagina.  No signs of injury around the vagina. Vaginal discharge found.  White vaginal discharge.   Musculoskeletal: Normal range of motion.  Neurological: She is alert and oriented to person, place, and time. She has normal strength.  Skin: Skin is warm and dry. She is not diaphoretic. No erythema.  Psychiatric: She has a normal mood and affect. Her behavior is normal.    ED Course  Procedures (including critical care time)  DIAGNOSTIC STUDIES: Oxygen Saturation is 100% on RA, normal by my interpretation.     COORDINATION OF CARE: 5:26 PM-Discussed treatment plan which includes ultrasound and pelvic exam with pt at bedside and pt agreed to plan.   Labs Review Labs Reviewed  URINALYSIS, ROUTINE W REFLEX MICROSCOPIC - Abnormal; Notable for the following:    APPearance CLOUDY (*)    Specific Gravity, Urine 1.034 (*)    Leukocytes, UA SMALL (*)    All other components within normal limits  COMPREHENSIVE METABOLIC PANEL - Abnormal; Notable for the following:    Sodium 134 (*)    Total Bilirubin <0.2 (*)    All other components within normal limits  CBC WITH DIFFERENTIAL - Abnormal; Notable for the following:    RBC 3.86 (*)    HCT 35.4 (*)    All other components within normal limits  URINE MICROSCOPIC-ADD ON - Abnormal; Notable for the following:    Squamous Epithelial / LPF MANY (*)    Bacteria, UA FEW (*)    Crystals CA OXALATE CRYSTALS (*)    All other components within normal limits  HCG, QUANTITATIVE, PREGNANCY - Abnormal; Notable for the following:    hCG, Beta Chain, Quant, S [id number] (*)    All other components within normal limits  POC URINE PREG, ED - Abnormal; Notable for the following:    Preg Test, Ur POSITIVE (*)    All other components within normal limits  WET PREP, GENITAL  GC/CHLAMYDIA PROBE AMP  HIV ANTIBODY (ROUTINE TESTING)  ABO/RH    Imaging Review US Ob Comp Less 14 Wks  03/11/2014   CLINICAL DATA:  Patient pregnant with abdominal pain. Rule out ectopic pregnancy. Quantitative beta HCG pending. Estimated gestational age per LMP 7 weeks 6 days.  EXAM: OBSTETRIC <14 WK [place] AND TRANSVAGINAL OB US  TECHNIQUE: Both transabdominal and transvaginal ultrasound examinations were performed for complete evaluation of the gestation as well as the maternal uterus, adnexal regions, and pelvic cul-de-sac. Transvaginal technique was performed to assess early pregnancy.  COMPARISON:  None.  FINDINGS: Intrauterine gestational sac: Visualized/normal in shape.  Yolk sac:  Visualized.   Embryo:  Visualized.  Cardiac Activity: Visualized.  Heart Rate:  149 bpm  CRL:   16.9  mm   8 w 1 d                  [place] EDC: 10/20/2014  No evidence of subchorionic hemorrhage.  Maternal uterus/adnexae: Ovaries normal in size, shape and position. 1.8 cm right corpus luteum. No free fluid.  IMPRESSION: Single live IUP with estimated gestational age [redacted] weeks 1 day.   Electronically Signed   By: Marin Olp M.D.   On: 03/11/2014 18:39   US Ob Transvaginal  03/11/2014   CLINICAL DATA:  Patient pregnant with abdominal pain. Rule out ectopic pregnancy. Quantitative beta HCG pending. Estimated gestational age per LMP 7 weeks 6 days.  EXAM: OBSTETRIC <14 WK [place] AND TRANSVAGINAL OB US  TECHNIQUE: Both transabdominal and transvaginal ultrasound examinations were performed for complete evaluation of  the gestation as well as the maternal uterus, adnexal regions, and pelvic cul-de-sac. Transvaginal technique was performed to assess early pregnancy.  COMPARISON:  None.  FINDINGS: Intrauterine gestational sac: Visualized/normal in shape.  Yolk sac:  Visualized.  Embryo:  Visualized.  Cardiac Activity: Visualized.  Heart Rate:  149 bpm  CRL:   16.9  mm   8 w 1 d                  [place] EDC: 10/20/2014  No evidence of subchorionic hemorrhage.  Maternal uterus/adnexae: Ovaries normal in size, shape and position. 1.8 cm right corpus luteum. No free fluid.  IMPRESSION: Single live IUP with estimated gestational age [redacted] weeks 1 day.   Electronically Signed   By: Marin Olp M.D.   On: 03/11/2014 18:39     EKG Interpretation None      MDM   Final diagnoses:  Pregnancy  Constipation, unspecified constipation type   Patient presents to ED with constipation x 1 month and LMP 2 months ago. Patient was found to have 8 week IUP. HCG matches ultrasound. Patient has OB doctor to follow up with. No vaginal bleeding. No abdominal pain. Patient will follow up with her OB physician in the next few days. Discussed reasons to return to  ED or [facility] immediately. Vital signs stable for discharge. Patient / Family / Caregiver informed of clinical course, understand medical decision-making process, and agree with plan.   I personally performed the services described in this documentation, which was scribed in my presence. The recorded information has been reviewed and is accurate.  Elwyn Lade, PA-C 03/11/14 2132

## 2014-03-11 NOTE — ED Notes (Addendum)
Has missed her period LMP July first  and  Has not  Had bm for a month states abd is swollen  G 1 P 1 A0 L1 feels nauseated no vomiting no diarrhea

## 2014-03-11 NOTE — Discharge Instructions (Signed)
First Trimester of Pregnancy The first trimester of pregnancy is from week 1 until the end of week 12 (months 1 through 3). During this time, your baby will begin to develop inside you. At 6-8 weeks, the eyes and face are formed, and the heartbeat can be seen on ultrasound. At the end of 12 weeks, all the baby's organs are formed. Prenatal care is all the medical care you receive before the birth of your baby. Make sure you get good prenatal care and follow all of your doctor's instructions. HOME CARE  Medicines  Take medicine only as told by your doctor. Some medicines are safe and some are not during pregnancy.  Take your prenatal vitamins as told by your doctor.  Take medicine that helps you poop (stool softener) as needed if your doctor says it is okay. Diet  Eat regular, healthy meals.  Your doctor will tell you the amount of weight gain that is right for you.  Avoid raw meat and uncooked cheese.  If you feel sick to your stomach (nauseous) or throw up (vomit):  Eat 4 or 5 small meals a day instead of 3 large meals.  Try eating a few soda crackers.  Drink liquids between meals instead of during meals.  If you have a hard time pooping (constipation):  Eat high-fiber foods like fresh vegetables, fruit, and whole grains.  Drink enough fluids to keep your pee (urine) clear or pale yellow. Activity and Exercise  Exercise only as told by your doctor. Stop exercising if you have cramps or pain in your lower belly (abdomen) or low back.  Try to avoid standing for long periods of time. Move your legs often if you must stand in one place for a long time.  Avoid heavy lifting.  Wear low-heeled shoes. Sit and stand up straight.  You can have sex unless your doctor tells you not to. Relief of Pain or Discomfort  Wear a good support bra if your breasts are sore.  Take warm water baths (sitz baths) to soothe pain or discomfort caused by hemorrhoids. Use hemorrhoid cream if your  doctor says it is okay.  Rest with your legs raised if you have leg cramps or low back pain.  Wear support hose if you have puffy, bulging veins (varicose veins) in your legs. Raise (elevate) your feet for 15 minutes, 3-4 times a day. Limit salt in your diet. Prenatal Care  Schedule your prenatal visits by the twelfth week of pregnancy.  Write down your questions. Take them to your prenatal visits.  Keep all your prenatal visits as told by your doctor. Safety  Wear your seat belt at all times when driving.  Make a list of emergency phone numbers. The list should include numbers for family, friends, the hospital, and police and fire departments. General Tips  Ask your doctor for a referral to a local prenatal class. Begin classes no later than at the start of month 6 of your pregnancy.  Ask for help if you need counseling or help with nutrition. Your doctor can give you advice or tell you where to go for help.  Do not use hot tubs, steam rooms, or saunas.  Do not douche or use tampons or scented sanitary pads.  Do not cross your legs for long periods of time.  Avoid litter boxes and soil used by cats.  Avoid all smoking, herbs, and alcohol. Avoid drugs not approved by your doctor.  Visit your dentist. At home, brush your teeth   with a soft toothbrush. Be gentle when you floss. GET HELP IF:  You are dizzy.  You have mild cramps or pressure in your lower belly.  You have a nagging pain in your belly area.  You continue to feel sick to your stomach, throw up, or have watery poop (diarrhea).  You have a bad smelling fluid coming from your vagina.  You have pain with peeing (urination).  You have increased puffiness (swelling) in your face, hands, legs, or ankles. GET HELP RIGHT AWAY IF:   You have a fever.  You are leaking fluid from your vagina.  You have spotting or bleeding from your vagina.  You have very bad belly cramping or pain.  You gain or lose weight  rapidly.  You throw up blood. It may look like coffee grounds.  You are around people who have German measles, fifth disease, or chickenpox.  You have a very bad headache.  You have shortness of breath.  You have any kind of trauma, such as from a fall or a car accident. Document Released: 12/21/2007 Document Revised: 11/18/2013 Document Reviewed: 05/14/2013 [employer] Patient Information 2015 [employer]. This information is not intended to replace advice given to you by your health care provider. Make sure you discuss any questions you have with your health care provider.  

## 2014-03-11 NOTE — ED Notes (Signed)
Patient transported to Ultrasound 

## 2014-03-12 LAB — HIV ANTIBODY (ROUTINE TESTING W REFLEX): HIV: NONREACTIVE

## 2014-03-12 LAB — GC/CHLAMYDIA PROBE AMP
CT Probe RNA: NEGATIVE
GC PROBE AMP APTIMA: NEGATIVE

## 2014-03-12 NOTE — ED Provider Notes (Signed)
Medical screening examination/treatment/procedure(s) were performed by non-physician practitioner and as supervising physician I was immediately available for consultation/collaboration.  Richarda Blade, MD 03/12/14 [phone]

## 2014-04-25 LAB — OB RESULTS CONSOLE HIV ANTIBODY (ROUTINE TESTING): HIV: NONREACTIVE

## 2014-04-25 LAB — OB RESULTS CONSOLE GC/CHLAMYDIA
Chlamydia: NEGATIVE
Gonorrhea: NEGATIVE

## 2014-04-25 LAB — OB RESULTS CONSOLE RPR: RPR: NONREACTIVE

## 2014-04-25 LAB — OB RESULTS CONSOLE ABO/RH: RH Type: POSITIVE

## 2014-04-25 LAB — OB RESULTS CONSOLE HEPATITIS B SURFACE ANTIGEN: Hepatitis B Surface Ag: NEGATIVE

## 2014-04-25 LAB — OB RESULTS CONSOLE RUBELLA ANTIBODY, IGM: Rubella: NON-IMMUNE/NOT IMMUNE

## 2014-04-25 LAB — OB RESULTS CONSOLE ANTIBODY SCREEN: Antibody Screen: NEGATIVE

## 2014-05-08 ENCOUNTER — Ambulatory Visit: Payer: BC Managed Care – PPO | Admitting: Cardiology

## 2014-05-15 ENCOUNTER — Ambulatory Visit (INDEPENDENT_AMBULATORY_CARE_PROVIDER_SITE_OTHER): Payer: Medicaid Other | Admitting: Physician Assistant

## 2014-05-15 ENCOUNTER — Encounter: Payer: Self-pay | Admitting: Physician Assistant

## 2014-05-15 ENCOUNTER — Other Ambulatory Visit (INDEPENDENT_AMBULATORY_CARE_PROVIDER_SITE_OTHER): Payer: Medicaid Other

## 2014-05-15 VITALS — BP 118/72 | HR 74 | Ht 66.5 in | Wt 144.4 lb

## 2014-05-15 DIAGNOSIS — Z720 Tobacco use: Secondary | ICD-10-CM | POA: Insufficient documentation

## 2014-05-15 DIAGNOSIS — R0602 Shortness of breath: Secondary | ICD-10-CM

## 2014-05-15 DIAGNOSIS — R071 Chest pain on breathing: Secondary | ICD-10-CM

## 2014-05-15 DIAGNOSIS — I471 Supraventricular tachycardia: Secondary | ICD-10-CM

## 2014-05-15 DIAGNOSIS — E079 Disorder of thyroid, unspecified: Secondary | ICD-10-CM

## 2014-05-15 LAB — BASIC METABOLIC PANEL
BUN: 9 mg/dL (ref 6–23)
CO2: 21 mEq/L (ref 19–32)
Calcium: 8.9 mg/dL (ref 8.4–10.5)
Chloride: 103 mEq/L (ref 96–112)
Creatinine, Ser: 0.7 mg/dL (ref 0.4–1.2)
GFR: 117.58 mL/min (ref >60.00)
Glucose, Bld: 69 mg/dL — ABNORMAL LOW (ref 70–99)
POTASSIUM: 3.2 meq/L — AB (ref 3.5–5.1)
SODIUM: 132 meq/L — AB (ref 135–145)

## 2014-05-15 LAB — D-DIMER, QUANTITATIVE (NOT AT ARMC): D DIMER QUANT: 0.44 ug{FEU}/mL (ref 0.00–0.48)

## 2014-05-15 NOTE — Progress Notes (Signed)
[place], [place] Angela Guzman, Angela Guzman  [id number] Phone: [phone] Fax:  [phone]  Date:  05/15/2014   Patient ID:  Angela, Guzman 06/20/1991, MRN [id number]   PCP:  Alesia Richards, MD  Electrophysiologist:  Caryl Comes  History of Present Illness: Angela Guzman is a 23 y.o. pregnant female with history of paroxysmal SVT who presents for followup. 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 Valsalva has been unsuccessful for her episodes and she's required ED care with adenosine rx before. She has been treated with beta blocker therapy, reducing the frequency of her episodes. She now only takes this PRN. 2D Echo 07/2012: EF 55-65%, no RWMA, mitral valve systolic bowing without prolapse. In August 2015, she was found to be [redacted] weeks pregnant during an ER visit for constipation and abdominal pain. She recently saw her OB-GYN for routine prenatal care and was referred back to [place] for her history of SVT. She says she is now around [redacted] weeks pregnant. Her last episode of SVT was 3 months ago. She has not had any recurrences since then. However, she does report an occasional sensation every 2 weeks or so like she's about to go into SVT. She describes this as very brief chest tightness/SOB and heart pounding that lasts only a few brief seconds then stops spontaneously. Independent of this, she also reports a sense of intermittent sharp chest discomfort when she breathes in. She feels this precedes her pregnancy and goes back several months. She doesn't have it very often, but yesterday noticed it for about 30 minutes which is longer than usual. Today she feels fine without any chest pain, SOB, nausea, palpitations, near syncope or syncope. She has not had any exertional discomfort or dyspnea.  Recent Labs: 06/04/2013: TSH 2.409  03/11/2014: ALT 12; Creatinine 0.68; Hemoglobin 12.7; Potassium 3.8   Wt Readings from Last 3 Encounters:  05/15/14 144 lb 6.4 oz  (65.499 kg)  07/26/13 140 lb (63.504 kg)  06/04/13 137 lb (62.143 kg)     Past Medical History  Diagnosis Date  . UTI (lower urinary tract infection)   . SVT (supraventricular tachycardia)   . Allergic rhinitis   . Tobacco abuse     Current Outpatient Prescriptions  Medication Sig Dispense Refill  . metoprolol succinate (TOPROL-XL) 25 MG 24 hr tablet Take 12.5 mg by mouth daily as needed.      . Prenatal Vit-Fe Fumarate-FA (PRENATAL COMPLETE) 14-0.4 MG TABS Take 1 tablet by mouth daily.  60 each  0   No current facility-administered medications for this visit.    Allergies:   Review of patient's allergies indicates no known allergies.   Social History:  The patient  reports that she has been smoking Cigarettes.  She has been smoking about 0.50 packs per day. She does not have any smokeless tobacco history on file. She reports that she drinks alcohol. She reports that she does not use illicit drugs.   Family History:  The patient's family history includes Diabetes in her paternal grandmother; Heart Problems in some other family members; Hypertension in her paternal grandmother.   ROS:  Please see the history of present illness.    All other systems reviewed and negative.   PHYSICAL EXAM:  VS:  BP 118/72  Pulse 74  Ht 5' 6.5" (1.689 m)  Wt 144 lb 6.4 oz (65.499 kg)  BMI 22.96 kg/m2, pulse ox  Well nourished, well developed,  in no acute distress HEENT: normal Neck: no JVD Cardiac:  normal S1, S2; RRR; no murmur Lungs:  clear to auscultation bilaterally, no wheezing, rhonchi or rales Abd: soft, nontender, no hepatomegaly Ext: no edema Skin: warm and dry Neuro:  moves all extremities spontaneously, no focal abnormalities noted  EKG:  NSR 74bpm, no acute ST-T changes     ASSESSMENT AND PLAN:  1. SVT - quiscent. It is possible that her brief episodes now are due to ectopy (that perhaps used to precede SVT, although she has not progressed to that point recently). Reviewed  with Dr. Rayann Heman - OK to continue rare metoprolol PRN, however, if she begins to have more episodes she is to let us know. If we had to use a scheduled beta blocker, would consider one of the category B's such as pindolol or acebutolol. Check TSH to ensure euthyroid while pregnant. 2. Atypical chest discomfort - no particular pattern except occasionally on inspiration. It is not exertional. EKG is normal. She is not tachycardic, tachypnic or hypoxic. POx in the office was 96-99% even with ambulation. Will check d-dimer for risk stratification. If positive, will need to discuss with OB what their level of suspicion would have to be to pursue further imaging (vs whether they find this to be a variable complaint in early pregnancy). 3. Tobacco abuse - she has quit smoking.  Dispo: F/u Dr. Caryl Comes in 3 months.  Signed, Melina Copa, PA-C  05/15/2014 2:45 PM

## 2014-05-15 NOTE — Patient Instructions (Signed)
Your physician recommends that you continue on your current medications as directed. Please refer to the Current Medication list given to you today.   Your physician recommends that you return for lab work  TODAY BMET AND [place] recommends that you schedule a follow-up appointment in: [place]

## 2014-05-16 ENCOUNTER — Telehealth: Payer: Self-pay | Admitting: *Deleted

## 2014-05-16 DIAGNOSIS — E876 Hypokalemia: Secondary | ICD-10-CM

## 2014-05-16 LAB — TSH: TSH: 0.82 u[IU]/mL (ref 0.35–4.50)

## 2014-05-16 NOTE — Telephone Encounter (Signed)
Advised patient of lab results. Scheduled follow up labs and mailed information on high K+ foods. Patient unsure of OBGYN's name. She will make sure and have next week when called about follow up labs

## 2014-05-16 NOTE — Telephone Encounter (Signed)
Message copied by Earvin Hansen on Fri May 16, 2014 11:20 AM ------      Message from: Charlie Pitter      Created: Thu May 15, 2014  5:06 PM       Please let patient know d-dimer was normal so low likelihood of blood clot.      Her electrolyte panel showed borderline low sodium (which might be r/t pregnancy), low potassium and low blood sugar. Please encourage eating regular meals throughout the day. She should increase dietary intake of potassium in the form of beans, leafy greens, potatoes, squash, yogurt, avocados, mushrooms, bananas, and orange juice. Since she is pregnant will hold off on oral potassium supplementation and try to increase dietary sources. Would repeat BMET in about 5 days to recheck. She can do this here or at [facility].      Dayna Dunn PA-C       ------

## 2014-05-21 ENCOUNTER — Other Ambulatory Visit (INDEPENDENT_AMBULATORY_CARE_PROVIDER_SITE_OTHER): Payer: Medicaid Other | Admitting: *Deleted

## 2014-05-21 DIAGNOSIS — E876 Hypokalemia: Secondary | ICD-10-CM

## 2014-05-22 LAB — BASIC METABOLIC PANEL
BUN: 13 mg/dL (ref 6–23)
CO2: 26 mEq/L (ref 19–32)
Calcium: 8.7 mg/dL (ref 8.4–10.5)
Chloride: 106 mEq/L (ref 96–112)
Creatinine, Ser: 0.6 mg/dL (ref 0.4–1.2)
GFR: 142.11 mL/min (ref >60.00)
GLUCOSE: 87 mg/dL (ref 70–99)
POTASSIUM: 3.6 meq/L (ref 3.5–5.1)
SODIUM: 136 meq/L (ref 135–145)

## 2014-05-23 ENCOUNTER — Telehealth: Payer: Self-pay | Admitting: *Deleted

## 2014-05-23 NOTE — Telephone Encounter (Signed)
pt notified about lab results and recommendations to continue w/K+ rich foods. Pt verbalized understanding.

## 2014-06-01 ENCOUNTER — Emergency Department (HOSPITAL_COMMUNITY)
Admission: EM | Admit: 2014-06-01 | Discharge: 2014-06-01 | Disposition: A | Discharge status: Home / Self Care | Payer: Medicaid Other | Attending: Emergency Medicine | Admitting: Emergency Medicine

## 2014-06-01 ENCOUNTER — Encounter (HOSPITAL_COMMUNITY): Payer: Self-pay

## 2014-06-01 DIAGNOSIS — R21 Rash and other nonspecific skin eruption: Secondary | ICD-10-CM | POA: Insufficient documentation

## 2014-06-01 DIAGNOSIS — I471 Supraventricular tachycardia: Secondary | ICD-10-CM | POA: Diagnosis not present

## 2014-06-01 DIAGNOSIS — Z720 Tobacco use: Secondary | ICD-10-CM | POA: Insufficient documentation

## 2014-06-01 DIAGNOSIS — Z8709 Personal history of other diseases of the respiratory system: Secondary | ICD-10-CM | POA: Diagnosis not present

## 2014-06-01 DIAGNOSIS — Z79899 Other long term (current) drug therapy: Secondary | ICD-10-CM | POA: Insufficient documentation

## 2014-06-01 DIAGNOSIS — Z87440 Personal history of urinary (tract) infections: Secondary | ICD-10-CM | POA: Diagnosis not present

## 2014-06-01 MED ORDER — PERMETHRIN 5 % EX CREA: TOPICAL_CREAM | CUTANEOUS | Status: DC

## 2014-06-01 MED ORDER — MUPIROCIN CALCIUM 2 % EX CREA: 1.0000 "application " | TOPICAL_CREAM | Freq: Two times a day (BID) | CUTANEOUS | Status: DC

## 2014-06-01 NOTE — ED Notes (Signed)
Pt here for rash all over, went to urgent care yesterday and was given Rx for augmentin and not any better. Pt is [redacted] wks pregnant and took some tylenol

## 2014-06-01 NOTE — ED Provider Notes (Signed)
CSN: [id number]     Arrival date & time 06/01/14  1007 History  This chart was scribed for non-physician practitioner Hyman Bible, PA-C working with Leota Jacobsen, MD by Rayfield Citizen, ED Scribe. This patient was seen in room TR11C/TR11C and the patient's care was started at 10:55 AM.    Chief Complaint  Patient presents with  . Rash   The history is provided by the patient. No language interpreter was used.     HPI Comments: Angela Guzman is a 23 y.o. female who presents to the Emergency Department complaining of an itchy rash, beginning on her hands, spreading to her arms and face, and this morning, proceeding to the bottoms of her feet. She describes the areas on her face as "blisters" on the left side of her nose and on the chin under her bottom lip, beginning last night. She was seen yesterday at Urgent Care at which time she was prescribed Augmentin; she reports that her symptoms have not improved with this medication.   She reports a fever (TMAX 100.7) 4 days PTA; no fevers since then. She denies oral swelling, SOB, nausea, or vomiting. She denies new detergents, medications, lotions, soaps, etc. She reports that her daughter has a similar rash on her feet.  She has attempted to manage her symptoms with cortisone cream.   She is currently pregnant  She denies abdominal pain, vaginal bleeding, vaginal discharge - her OB is Jody Bovard.   Past Medical History  Diagnosis Date  . UTI (lower urinary tract infection)   . SVT (supraventricular tachycardia)   . Allergic rhinitis   . Tobacco abuse    No past surgical history on file. Family History  Problem Relation Age of Onset  . Heart Problems      cabg  . Heart Problems      cabg  . Hypertension Paternal Grandmother   . Diabetes Paternal Grandmother    History  Substance Use Topics  . Smoking status: Current Every Day Smoker -- 0.50 packs/day    Types: Cigarettes  . Smokeless tobacco: Not on file  . Alcohol Use: Yes      Comment: occasionally   OB History    Gravida Para Term Preterm AB TAB SAB Ectopic Multiple Living   1              Review of Systems  Respiratory: Negative for shortness of breath.   Gastrointestinal: Negative for nausea and vomiting.  Skin: Positive for rash.  All other systems reviewed and are negative.  Allergies  Review of patient's allergies indicates no known allergies.  Home Medications   Prior to Admission medications   Medication Sig Start Date End Date Taking? Authorizing Provider  metoprolol succinate (TOPROL-XL) 25 MG 24 hr tablet Take 12.5 mg by mouth daily as needed (palpitations).    Yes Historical Provider, MD  Prenatal Vit-Fe Fumarate-FA (PRENATAL COMPLETE) 14-0.4 MG TABS Take 1 tablet by mouth daily. Patient not taking: Reported on 06/01/2014 03/11/14   Elwyn Lade, PA-C   BP 129/65 mmHg  Pulse 102  Temp(Src) 98.1 F (36.7 C) (Oral)  Resp 18  SpO2 100%  LMP 10/27/2013 Physical Exam  Constitutional: She is oriented to person, place, and time. She appears well-developed and well-nourished.  HENT:  Head: Normocephalic and atraumatic.  Mouth/Throat: Oropharynx is clear and moist.  No swelling of the lips, tongue, or throat  Neck: No tracheal deviation present.  Cardiovascular: Normal rate, regular rhythm and normal heart sounds.  Pulmonary/Chest: Effort normal and breath sounds normal.  Neurological: She is alert and oriented to person, place, and time.  Skin: Skin is warm and dry. Rash noted.  Erythematous papular rash located on: - Both hands (both dorsal and palmar aspects and web spaces of the fingers) - Left wrist  - Left lateral abdomen - Both lower legs and lower thighs - Dorsal and plantar aspects of both feet   Erythematous papules with honey crusted lesions on the nose.  No drainage  Psychiatric: She has a normal mood and affect. Her behavior is normal.  Nursing note and vitals reviewed.   ED Course  Procedures   DIAGNOSTIC  STUDIES: Oxygen Saturation is 100% on RA, normal by my interpretation.    COORDINATION OF CARE: 11:04 AM Discussed treatment plan with pt at bedside and pt agreed to plan.   Labs Review Labs Reviewed - No data to display  Imaging Review No results found.   EKG Interpretation None      MDM   Final diagnoses:  None   Patient presenting with a rash located in the web spaces of fingers and toes.  Daughter with similar rash.  Appearance and distribution most consistent with Scabies.  Patient given Rx for Permethrin.  Stable for discharge.  Return precautions given.     Hyman Bible, PA-C 06/03/14 1434  Leota Jacobsen, MD 06/04/14 [phone]

## 2014-06-01 NOTE — Discharge Instructions (Signed)
Apply Permethrin cream to the rash on your body (not the face).  Leave the cream on for 12 hours and rinse off.  Only have to do this once.  Use bactroban on the areas on the face.

## 2014-07-15 ENCOUNTER — Encounter (HOSPITAL_COMMUNITY): Payer: Self-pay | Admitting: *Deleted

## 2014-07-15 ENCOUNTER — Emergency Department (HOSPITAL_COMMUNITY)
Admission: EM | Admit: 2014-07-15 | Discharge: 2014-07-15 | Disposition: A | Discharge status: Home / Self Care | Payer: Medicaid Other | Attending: Emergency Medicine | Admitting: Emergency Medicine

## 2014-07-15 DIAGNOSIS — R51 Headache: Secondary | ICD-10-CM | POA: Insufficient documentation

## 2014-07-15 DIAGNOSIS — R1084 Generalized abdominal pain: Secondary | ICD-10-CM | POA: Insufficient documentation

## 2014-07-15 DIAGNOSIS — Z87440 Personal history of urinary (tract) infections: Secondary | ICD-10-CM | POA: Insufficient documentation

## 2014-07-15 DIAGNOSIS — R109 Unspecified abdominal pain: Secondary | ICD-10-CM

## 2014-07-15 DIAGNOSIS — Z87891 Personal history of nicotine dependence: Secondary | ICD-10-CM | POA: Insufficient documentation

## 2014-07-15 DIAGNOSIS — R0602 Shortness of breath: Secondary | ICD-10-CM | POA: Diagnosis present

## 2014-07-15 DIAGNOSIS — R002 Palpitations: Secondary | ICD-10-CM | POA: Diagnosis not present

## 2014-07-15 LAB — CBC
HCT: 35.8 % — ABNORMAL LOW (ref 36.0–46.0)
HEMOGLOBIN: 12.2 g/dL (ref 12.0–15.0)
MCH: 30.4 pg (ref 26.0–34.0)
MCHC: 34.1 g/dL (ref 30.0–36.0)
MCV: 89.3 fL (ref 78.0–100.0)
Platelets: 165 10*3/uL (ref 150–400)
RBC: 4.01 MIL/uL (ref 3.87–5.11)
RDW: 13.6 % (ref 11.5–15.5)
WBC: 9.6 10*3/uL (ref 4.0–10.5)

## 2014-07-15 LAB — COMPREHENSIVE METABOLIC PANEL
ALK PHOS: 56 U/L (ref 39–117)
ALT: 15 U/L (ref 0–35)
ANION GAP: 9 (ref 5–15)
AST: 23 U/L (ref 0–37)
Albumin: 3.2 g/dL — ABNORMAL LOW (ref 3.5–5.2)
BUN: 9 mg/dL (ref 6–23)
CHLORIDE: 106 meq/L (ref 96–112)
CO2: 22 mmol/L (ref 19–32)
Calcium: 8.9 mg/dL (ref 8.4–10.5)
Creatinine, Ser: 0.65 mg/dL (ref 0.50–1.10)
GFR calc non Af Amer: >90 mL/min (ref >90)
GLUCOSE: 99 mg/dL (ref 70–99)
POTASSIUM: 3.9 mmol/L (ref 3.5–5.1)
Sodium: 137 mmol/L (ref 135–145)
Total Bilirubin: 0.4 mg/dL (ref 0.3–1.2)
Total Protein: 6.2 g/dL (ref 6.0–8.3)

## 2014-07-15 LAB — URINALYSIS, ROUTINE W REFLEX MICROSCOPIC
Bilirubin Urine: NEGATIVE
Glucose, UA: NEGATIVE mg/dL
HGB URINE DIPSTICK: NEGATIVE
KETONES UR: NEGATIVE mg/dL
NITRITE: NEGATIVE
Protein, ur: NEGATIVE mg/dL
Specific Gravity, Urine: 1.008 (ref 1.005–1.030)
UROBILINOGEN UA: 0.2 mg/dL (ref 0.0–1.0)
pH: 7 (ref 5.0–8.0)

## 2014-07-15 LAB — URINE MICROSCOPIC-ADD ON

## 2014-07-15 MED ORDER — SODIUM CHLORIDE 0.9 % IV BOLUS (SEPSIS)
1000.0000 mL | Freq: Once | INTRAVENOUS | Status: AC
  Administered 2014-07-15: 1000 mL via INTRAVENOUS

## 2014-07-15 NOTE — Discharge Instructions (Signed)
Abdominal Pain During Pregnancy Belly (abdominal) pain is common during pregnancy. Most of the time, it is not a serious problem. Other times, it can be a sign that something is wrong with the pregnancy. Always tell your doctor if you have belly pain. HOME CARE Monitor your belly pain for any changes. The following actions may help you feel better:  Do not have sex (intercourse) or put anything in your vagina until you feel better.  Rest until your pain stops.  Drink clear fluids if you feel sick to your stomach (nauseous). Do not eat solid food until you feel better.  Only take medicine as told by your doctor.  Keep all doctor visits as told. GET HELP RIGHT AWAY IF:   You are bleeding, leaking fluid, or pieces of tissue come out of your vagina.  You have more pain or cramping.  You keep throwing up (vomiting).  You have pain when you pee (urinate) or have blood in your pee.  You have a fever.  You do not feel your baby moving as much.  You feel very weak or feel like passing out.  You have trouble breathing, with or without belly pain.  You have a very bad headache and belly pain.  You have fluid leaking from your vagina and belly pain.  You keep having watery poop (diarrhea).  Your belly pain does not go away after resting, or the pain gets worse. MAKE SURE YOU:   Understand these instructions.  Will watch your condition.  Will get help right away if you are not doing well or get worse. Document Released: 06/22/2009 Document Revised: 03/06/2013 Document Reviewed: 01/31/2013 [facility] Patient Information 2015 [place], [place]. This information is not intended to replace advice given to you by your health care provider. Make sure you discuss any questions you have with your health care provider.  Supraventricular Tachycardia Supraventricular tachycardia (SVT) is an abnormal heart rhythm (arrhythmia) that causes the heart to beat very fast (tachycardia). This kind of  fast heartbeat originates in the upper chambers of the heart (atria). SVT can cause the heart to beat greater than 100 beats per minute. SVT can have a rapid burst of heartbeats. This can start and stop suddenly without warning and is called nonsustained. SVT can also be sustained, in which the heart beats at a continuous fast rate.  CAUSES  There can be different causes of SVT. Some of these include:  Heart valve problems such as mitral valve prolapse.  An enlarged heart (hypertrophic cardiomyopathy).  Congenital heart problems.  Heart inflammation (pericarditis).  Hyperthyroidism.  Low potassium or magnesium levels.  Caffeine.  Drug use such as cocaine, methamphetamines, or stimulants.  Some over-the-counter medicines such as:  Decongestants.  Diet medicines.  Herbal medicines. SYMPTOMS  Symptoms of SVT can vary. Symptoms depend on whether the SVT is sustained or nonsustained. You may experience:  No symptoms (asymptomatic).  An awareness of your heart beating rapidly (palpitations).  Shortness of breath.  Chest pain or pressure. If your blood pressure drops because of the SVT, you may experience:  Fainting or near fainting.  Weakness.  Dizziness. DIAGNOSIS  Different tests can be performed to diagnose SVT, such as:  An electrocardiogram (EKG). This is a painless test that records the electrical activity of your heart.  Holter monitor. This is a 24 hour recording of your heart rhythm. You will be given a diary. Write down all symptoms that you have and what you were doing at the time you experienced  symptoms.  Arrhythmia monitor. This is a small device that your wear for several weeks. It records the heart rhythm when you have symptoms.  Echocardiogram. This is an imaging test to help detect abnormal heart structure such as congenital abnormalities, heart valve problems, or heart enlargement.  Stress test. This test can help determine if the SVT is related to  exercise.  Electrophysiology study (EPS). This is a procedure that evaluates your heart's electrical system and can help your caregiver find the cause of your SVT. TREATMENT  Treatment of SVT depends on the symptoms, how often it recurs, and whether there are any underlying heart problems.   If symptoms are rare and no other cardiac disease is present, no treatment may be needed.  Blood work may be done to check potassium, magnesium, and thyroid hormone levels to see if they are abnormal. If these levels are abnormal, treatment to correct the problems will occur. Medicines Your caregiver may use oral medicines to treat SVT. These medicines are given for long-term control of SVT. Medicines may be used alone or in combination with other treatments. These medicines work to slow nerve impulses in the heart muscle. These medicines can also be used to treat high blood pressure. Some of these medicines may include:  Calcium channel blockers.  Beta blockers.  Digoxin. Nonsurgical procedures Nonsurgical techniques may be used if oral medicines do not work. Some examples include:  Cardioversion. This technique uses either drugs or an electrical shock to restore a normal heart rhythm.  Cardioversion drugs may be given through an intravenous (IV) line to help "reset" the heart rhythm.  In electrical cardioversion, the caregiver shocks your heart to stop its beat for a split second. This helps to reset the heart to a normal rhythm.  Ablation. This procedure is done under mild sedation. High frequency radio wave energy is used to destroy the area of heart tissue responsible for the SVT. HOME CARE INSTRUCTIONS   Do not smoke.  Only take medicines prescribed by your caregiver. Check with your caregiver before using over-the-counter medicines.  Check with your caregiver about how much alcohol and caffeine (coffee, tea, colas, or chocolate) you may have.  It is very important to keep all follow-up  referrals and appointments in order to properly manage this problem. SEEK IMMEDIATE MEDICAL CARE IF:  You have dizziness.  You faint or nearly faint.  You have shortness of breath.  You have chest pain or pressure.  You have sudden nausea or vomiting.  You have profuse sweating.  You are concerned about how long your symptoms last.  You are concerned about the frequency of your SVT episodes. If you have the above symptoms, call your local emergency services (911 in [place] immediately. Do not drive yourself to the hospital. MAKE SURE YOU:   Understand these instructions.  Will watch your condition.  Will get help right away if you are not doing well or get worse. Document Released: 07/04/2005 Document Revised: 09/26/2011 Document Reviewed: 10/16/2008 [facility] Patient Information 2015 [place], [place]. This information is not intended to replace advice given to you by your health care provider. Make sure you discuss any questions you have with your health care provider.

## 2014-07-15 NOTE — ED Notes (Signed)
Pt verbalized understanding of discharge instructions and no further questions

## 2014-07-15 NOTE — ED Notes (Addendum)
Rapid OB nurse called she advised she would be on her way to see the patient.

## 2014-07-15 NOTE — Progress Notes (Signed)
1555  Arrived to evaluate this 23yo G2P1 @ 26.[redacted] wks GA in with complaint of "SVT", SOB, abdominal cramping, and vaginal pressure.  Reports good fetal movement, and no vaginal bleeding or leaking of fluid.  Abdomen is non tender to palpation.  [place]  Dr. Melba Coon notified of patient and of complaints.  Notified of FHR tracing Category I and no contractions.  Discussed ED plan of care.  Patient is OB cleared by Dr. Melba Coon and should follow up as scheduled 07/28/14 in the office.  Dr. Melba Coon also recommends patient to follow up with her cardiologist.  No SVE recommended at this time.

## 2014-07-15 NOTE — ED Provider Notes (Signed)
CSN: [id number]     Arrival date & time 07/15/14  1505 History   First MD Initiated Contact with Patient 07/15/14 1534     Chief Complaint  Patient presents with  . Shortness of Breath     (Consider location/radiation/quality/duration/timing/severity/associated sxs/prior Treatment) HPI Patient presents with acute onset of palpitations starting around 1 PM. She states she was talking to her daughter when symptoms began. She has a history of SVT and states the symptoms were similar to prior episodes. She measured her heart rate and states it was over 200 and time. She had associated lightheadedness and shortness of breath. She states she did have some chest tightness but denies any specific pain. Patient is [redacted] weeks pregnant. She's had no complications with the pregnancy thus far. She's had no vaginal bleeding or discharge. Patient states she has been having abdominal cramping worse for the past several days. Her OB/GYN assures her that this is normal during her pregnancy. She complains of mild abdominal cramping at this time. She's had no nausea, vomiting or diarrhea. Denies any fever or chills. Denies any urinary symptoms.  Palpitations have improved. No current dizziness or shortness of breath. Past Medical History  Diagnosis Date  . UTI (lower urinary tract infection)   . SVT (supraventricular tachycardia)   . Allergic rhinitis   . Tobacco abuse    Past Surgical History  Procedure Laterality Date  . No past surgeries     Family History  Problem Relation Age of Onset  . Heart Problems      cabg  . Heart Problems      cabg  . Hypertension Paternal Grandmother   . Diabetes Paternal Grandmother    History  Substance Use Topics  . Smoking status: Former Smoker -- 0.50 packs/day    Types: Cigarettes    Quit date: 01/15/2014  . Smokeless tobacco: Never Used  . Alcohol Use: No     Comment: occasionally   OB History    Gravida Para Term Preterm AB TAB SAB Ectopic Multiple Living    2 1 1  0 0 0 0 0 0 1     Review of Systems  Constitutional: Negative for fever and chills.  Respiratory: Negative for cough and shortness of breath.   Cardiovascular: Positive for palpitations. Negative for chest pain and leg swelling.  Gastrointestinal: Positive for abdominal pain. Negative for nausea, vomiting, diarrhea and constipation.  Genitourinary: Negative for dysuria, hematuria, vaginal bleeding, vaginal discharge and pelvic pain.  Musculoskeletal: Negative for back pain, neck pain and neck stiffness.  Skin: Negative for rash and wound.  Neurological: Positive for dizziness and light-headedness. Negative for syncope, weakness, numbness and headaches.  All other systems reviewed and are negative.     Allergies  Review of patient's allergies indicates no known allergies.  Home Medications   Prior to Admission medications   Medication Sig Start Date End Date Taking? Authorizing Provider  metoprolol succinate (TOPROL-XL) 25 MG 24 hr tablet Take 12.5 mg by mouth daily as needed (palpitations).    Yes Historical Provider, MD  Multiple Vitamin (MULTIVITAMIN) capsule Take 1 capsule by mouth 2 (two) times daily.   Yes Historical Provider, MD  mupirocin cream (BACTROBAN) 2 % Apply 1 application topically 2 (two) times daily. Use for 7 days Patient not taking: Reported on 07/15/2014 06/01/14   Hyman Bible, PA-C  permethrin (ELIMITE) 5 % cream Apply to affected area once Patient not taking: Reported on 07/15/2014 06/01/14   Hyman Bible, PA-C  Prenatal Vit-Fe  Fumarate-FA (PRENATAL COMPLETE) 14-0.4 MG TABS Take 1 tablet by mouth daily. Patient not taking: Reported on 06/01/2014 03/11/14   Kara Mead Merrell, PA-C   BP 103/58 mmHg  Pulse 97  Temp(Src) 98 F (36.7 C) (Oral)  Resp 23  Ht 5\' 7"  (1.702 m)  Wt 153 lb (69.4 kg)  BMI 23.96 kg/m2  SpO2 100%  LMP 10/27/2013 Physical Exam  Constitutional: She is oriented to person, place, and time. She appears well-developed and  well-nourished. No distress.  HENT:  Head: Normocephalic and atraumatic.  Mouth/Throat: Oropharynx is clear and moist.  Eyes: EOM are normal. Pupils are equal, round, and reactive to light.  Neck: Normal range of motion. Neck supple.  Cardiovascular: Normal rate and regular rhythm.   Pulmonary/Chest: Effort normal and breath sounds normal. No respiratory distress. She has no wheezes. She has no rales. She exhibits no tenderness.  Abdominal: Soft. Bowel sounds are normal. She exhibits no distension and no mass. There is tenderness (mild diffuse tenderness without focality. No rebound or guarding.). There is no rebound and no guarding.  Musculoskeletal: Normal range of motion. She exhibits no edema or tenderness.  No calf swelling or tenderness.  Neurological: She is alert and oriented to person, place, and time.  Skin: Skin is warm and dry. No rash noted. No erythema.  Psychiatric: She has a normal mood and affect. Her behavior is normal.  Nursing note and vitals reviewed.   ED Course  Procedures (including critical care time) Labs Review Labs Reviewed  CBC - Abnormal; Notable for the following:    HCT 35.8 (*)    All other components within normal limits  COMPREHENSIVE METABOLIC PANEL - Abnormal; Notable for the following:    Albumin 3.2 (*)    All other components within normal limits  URINALYSIS, ROUTINE W REFLEX MICROSCOPIC - Abnormal; Notable for the following:    Leukocytes, UA SMALL (*)    All other components within normal limits  URINE MICROSCOPIC-ADD ON - Abnormal; Notable for the following:    Bacteria, UA FEW (*)    All other components within normal limits    Imaging Review No results found.   EKG Interpretation None      Date: 07/15/2014  Rate: 125  Rhythm: sinus tachycardia  QRS Axis: normal  Intervals: normal  ST/T Wave abnormalities: normal  Conduction Disutrbances:none  Narrative Interpretation:   Old EKG Reviewed: unchanged   MDM   Final  diagnoses:  Palpitations  Abdominal cramping   Patient with improved tachycardia. Continues to deny lightheadedness, shortness of breath and palpitations. Evaluated by OB nurse. Fetal heart tones in the 130s with no D cells. OB recommends discharge home for follow-up. Patient is advised to follow-up with her cardiologist's for recurrent SVT. Return precautions have been given.    Julianne Rice, MD 07/15/14 [phone]

## 2014-07-15 NOTE — ED Notes (Signed)
OB nurse at bedside with this RN

## 2014-07-15 NOTE — ED Notes (Signed)
Pt in c/o shortness of breath and palpitations for the last 2 hours, symptoms have subsided at this time, pt is 6 months pregnant, pt now reports that since palpitations have improved she is c/o abd cramping, denies changes in vaginal discharge, denies bleeding

## 2014-07-18 NOTE — L&D Delivery Note (Signed)
Delivery Note At 5:29 PM a viable and healthy female was delivered via  (Presentation: LOA  ).  APGAR: 9,9 ; weight  P.   Placenta status: delivered, intact .  Cord: 3VC  with the following complications: none .   Anesthesia: Epidural  Episiotomy:  N/A Lacerations:  N/A Suture Repair: N/A Est. Blood Loss (mL): 400cc   Mom to postpartum.  Baby to Couplet care / Skin to Skin.  Bovard-Stuckert, Angela Guzman 10/16/2014, 5:46 PM  Bo/RNI/Tdap in PNC/Contra unsure/O+

## 2014-08-15 ENCOUNTER — Ambulatory Visit: Payer: Medicaid Other | Admitting: Internal Medicine

## 2014-09-22 LAB — OB RESULTS CONSOLE GBS: STREP GROUP B AG: POSITIVE

## 2014-10-09 ENCOUNTER — Telehealth (HOSPITAL_COMMUNITY): Payer: Self-pay | Admitting: *Deleted

## 2014-10-09 ENCOUNTER — Encounter (HOSPITAL_COMMUNITY): Payer: Self-pay | Admitting: *Deleted

## 2014-10-09 NOTE — Telephone Encounter (Signed)
Preadmission screen  

## 2014-10-10 ENCOUNTER — Encounter (HOSPITAL_COMMUNITY): Payer: Self-pay

## 2014-10-10 ENCOUNTER — Inpatient Hospital Stay (HOSPITAL_COMMUNITY)
Admission: AD | Admit: 2014-10-10 | Discharge: 2014-10-10 | Disposition: A | Discharge status: Home / Self Care | Payer: Medicaid Other | Source: Ambulatory Visit | Attending: Obstetrics and Gynecology | Admitting: Obstetrics and Gynecology

## 2014-10-10 DIAGNOSIS — Z3A38 38 weeks gestation of pregnancy: Secondary | ICD-10-CM | POA: Insufficient documentation

## 2014-10-10 LAB — WET PREP, GENITAL
Clue Cells Wet Prep HPF POC: NONE SEEN
Trich, Wet Prep: NONE SEEN
Yeast Wet Prep HPF POC: NONE SEEN

## 2014-10-10 LAB — POCT FERN TEST: POCT FERN TEST: NEGATIVE

## 2014-10-10 NOTE — Discharge Instructions (Signed)
Braxton Hicks Contractions °Contractions of the uterus can occur throughout pregnancy. Contractions are not always a sign that you are in labor.  °WHAT ARE BRAXTON HICKS CONTRACTIONS?  °Contractions that occur before labor are called Braxton Hicks contractions, or false labor. Toward the end of pregnancy (32-34 weeks), these contractions can develop more often and may become more forceful. This is not true labor because these contractions do not result in opening (dilatation) and thinning of the cervix. They are sometimes difficult to tell apart from true labor because these contractions can be forceful and people have different pain tolerances. You should not feel embarrassed if you go to the hospital with false labor. Sometimes, the only way to tell if you are in true labor is for your health care provider to look for changes in the cervix. °If there are no prenatal problems or other health problems associated with the pregnancy, it is completely safe to be sent home with false labor and await the onset of true labor. °HOW CAN YOU TELL THE DIFFERENCE BETWEEN TRUE AND FALSE LABOR? °False Labor °· The contractions of false labor are usually shorter and not as hard as those of true labor.   °· The contractions are usually irregular.   °· The contractions are often felt in the front of the lower abdomen and in the groin.   °· The contractions may go away when you walk around or change positions while lying down.   °· The contractions get weaker and are shorter lasting as time goes on.   °· The contractions do not usually become progressively stronger, regular, and closer together as with true labor.   °True Labor °· Contractions in true labor last 30-70 seconds, become very regular, usually become more intense, and increase in frequency.   °· The contractions do not go away with walking.   °· The discomfort is usually felt in the top of the uterus and spreads to the lower abdomen and low back.   °· True labor can be  determined by your health care provider with an exam. This will show that the cervix is dilating and getting thinner.   °WHAT TO REMEMBER °· Keep up with your usual exercises and follow other instructions given by your health care provider.   °· Take medicines as directed by your health care provider.   °· Keep your regular prenatal appointments.   °· Eat and drink lightly if you think you are going into labor.   °· If Braxton Hicks contractions are making you uncomfortable:   °¨ Change your position from lying down or resting to walking, or from walking to resting.   °¨ Sit and rest in a tub of warm water.   °¨ Drink 2-3 glasses of water. Dehydration may cause these contractions.   °¨ Do slow and deep breathing several times an hour.   °WHEN SHOULD I SEEK IMMEDIATE MEDICAL CARE? °Seek immediate medical care if: °· Your contractions become stronger, more regular, and closer together.   °· You have fluid leaking or gushing from your vagina.   °· You have a fever.   °· You pass blood-tinged mucus.   °· You have vaginal bleeding.   °· You have continuous abdominal pain.   °· You have low back pain that you never had before.   °· You feel your baby's head pushing down and causing pelvic pressure.   °· Your baby is not moving as much as it used to.   °Document Released: 07/04/2005 Document Revised: 07/09/2013 Document Reviewed: 04/15/2013 °Exit[employer]® Patient Information ©2015 [employer]. This information is not intended to replace advice given to you by your health care   provider. Make sure you discuss any questions you have with your health care provider. °Fetal Movement Counts °Patient Name: __________________________________________________ Patient Due Date: ____________________ °Performing a fetal movement count is highly recommended in high-risk pregnancies, but it is good for every pregnant woman to do. Your health care provider may ask you to start counting fetal movements at 28 weeks of the pregnancy. Fetal  movements often increase: °· After eating a full meal. °· After physical activity. °· After eating or drinking something sweet or cold. °· At rest. °Pay attention to when you feel the baby is most active. This will help you notice a pattern of your baby's sleep and wake cycles and what factors contribute to an increase in fetal movement. It is important to perform a fetal movement count at the same time each day when your baby is normally most active.  °HOW TO COUNT FETAL MOVEMENTS °· Find a quiet and comfortable area to sit or lie down on your left side. Lying on your left side provides the best blood and oxygen circulation to your baby. °· Write down the day and time on a sheet of paper or in a journal. °· Start counting kicks, flutters, swishes, rolls, or jabs in a 2-hour period. You should feel at least 10 movements within 2 hours. °· If you do not feel 10 movements in 2 hours, wait 2-3 hours and count again. Look for a change in the pattern or not enough counts in 2 hours. °SEEK MEDICAL CARE IF: °· You feel less than 10 counts in 2 hours, tried twice. °· There is no movement in over an hour. °· The pattern is changing or taking longer each day to reach 10 counts in 2 hours. °· You feel the baby is not moving as he or she usually does. °Date: ____________ Movements: ____________ Start time: ____________ Finish time: ____________  °Date: ____________ Movements: ____________ Start time: ____________ Finish time: ____________ °Date: ____________ Movements: ____________ Start time: ____________ Finish time: ____________ °Date: ____________ Movements: ____________ Start time: ____________ Finish time: ____________ °Date: ____________ Movements: ____________ Start time: ____________ Finish time: ____________ °Date: ____________ Movements: ____________ Start time: ____________ Finish time: ____________ °Date: ____________ Movements: ____________ Start time: ____________ Finish time: ____________ °Date: ____________  Movements: ____________ Start time: ____________ Finish time: ____________  °Date: ____________ Movements: ____________ Start time: ____________ Finish time: ____________ °Date: ____________ Movements: ____________ Start time: ____________ Finish time: ____________ °Date: ____________ Movements: ____________ Start time: ____________ Finish time: ____________ °Date: ____________ Movements: ____________ Start time: ____________ Finish time: ____________ °Date: ____________ Movements: ____________ Start time: ____________ Finish time: ____________ °Date: ____________ Movements: ____________ Start time: ____________ Finish time: ____________ °Date: ____________ Movements: ____________ Start time: ____________ Finish time: ____________  °Date: ____________ Movements: ____________ Start time: ____________ Finish time: ____________ °Date: ____________ Movements: ____________ Start time: ____________ Finish time: ____________ °Date: ____________ Movements: ____________ Start time: ____________ Finish time: ____________ °Date: ____________ Movements: ____________ Start time: ____________ Finish time: ____________ °Date: ____________ Movements: ____________ Start time: ____________ Finish time: ____________ °Date: ____________ Movements: ____________ Start time: ____________ Finish time: ____________ °Date: ____________ Movements: ____________ Start time: ____________ Finish time: ____________  °Date: ____________ Movements: ____________ Start time: ____________ Finish time: ____________ °Date: ____________ Movements: ____________ Start time: ____________ Finish time: ____________ °Date: ____________ Movements: ____________ Start time: ____________ Finish time: ____________ °Date: ____________ Movements: ____________ Start time: ____________ Finish time: ____________ °Date: ____________ Movements: ____________ Start time: ____________ Finish time: ____________ °Date: ____________ Movements: ____________ Start time:  ____________ Finish time: ____________ °Date: ____________ Movements: ____________   Start time: ____________ Finish time: ____________  °Date: ____________ Movements: ____________ Start time: ____________ Finish time: ____________ °Date: ____________ Movements: ____________ Start time: ____________ Finish time: ____________ °Date: ____________ Movements: ____________ Start time: ____________ Finish time: ____________ °Date: ____________ Movements: ____________ Start time: ____________ Finish time: ____________ °Date: ____________ Movements: ____________ Start time: ____________ Finish time: ____________ °Date: ____________ Movements: ____________ Start time: ____________ Finish time: ____________ °Date: ____________ Movements: ____________ Start time: ____________ Finish time: ____________  °Date: ____________ Movements: ____________ Start time: ____________ Finish time: ____________ °Date: ____________ Movements: ____________ Start time: ____________ Finish time: ____________ °Date: ____________ Movements: ____________ Start time: ____________ Finish time: ____________ °Date: ____________ Movements: ____________ Start time: ____________ Finish time: ____________ °Date: ____________ Movements: ____________ Start time: ____________ Finish time: ____________ °Date: ____________ Movements: ____________ Start time: ____________ Finish time: ____________ °Date: ____________ Movements: ____________ Start time: ____________ Finish time: ____________  °Date: ____________ Movements: ____________ Start time: ____________ Finish time: ____________ °Date: ____________ Movements: ____________ Start time: ____________ Finish time: ____________ °Date: ____________ Movements: ____________ Start time: ____________ Finish time: ____________ °Date: ____________ Movements: ____________ Start time: ____________ Finish time: ____________ °Date: ____________ Movements: ____________ Start time: ____________ Finish time: ____________ °Date:  ____________ Movements: ____________ Start time: ____________ Finish time: ____________ °Date: ____________ Movements: ____________ Start time: ____________ Finish time: ____________  °Date: ____________ Movements: ____________ Start time: ____________ Finish time: ____________ °Date: ____________ Movements: ____________ Start time: ____________ Finish time: ____________ °Date: ____________ Movements: ____________ Start time: ____________ Finish time: ____________ °Date: ____________ Movements: ____________ Start time: ____________ Finish time: ____________ °Date: ____________ Movements: ____________ Start time: ____________ Finish time: ____________ °Date: ____________ Movements: ____________ Start time: ____________ Finish time: ____________ °Document Released: 08/03/2006 Document Revised: 11/18/2013 Document Reviewed: 04/30/2012 °[employer]® Patient Information ©2015 [employer]. This information is not intended to replace advice given to you by your health care provider. Make sure you discuss any questions you have with your health care provider. ° °

## 2014-10-10 NOTE — MAU Note (Signed)
Pain in pelvic area and pressure all day. Some contractions. Denies bleeding or LOF

## 2014-10-12 NOTE — Progress Notes (Signed)
FHT from 3-25 reviewed.  Reactive NST, irregular ctx.

## 2014-10-15 ENCOUNTER — Encounter (HOSPITAL_COMMUNITY): Payer: Self-pay

## 2014-10-15 DIAGNOSIS — Z3483 Encounter for supervision of other normal pregnancy, third trimester: Secondary | ICD-10-CM

## 2014-10-15 NOTE — H&P (Addendum)
Angela Guzman is a 24 y.o. female G2P1001 at 33+ for iol, given term status and favorable cervix. Has h/o SVT, taking metoprolol prn, has seen cardiology. Also rubella nonimmune.  +FM, no LOF, no VB, occ ctx.  D/w pt process of IOL   Maternal Medical History:  Contractions: Frequency: irregular.    Fetal activity: Perceived fetal activity is normal.    Prenatal Complications - Diabetes: none.    OB History    Gravida Para Term Preterm AB TAB SAB Ectopic Multiple Living   2 1 1  0 0 0 0 0 0 1    G1 SVD female G2 present No abn pap, last 3/14 No STD  Past Medical History  Diagnosis Date  . UTI (lower urinary tract infection)   . SVT (supraventricular tachycardia)   . Allergic rhinitis   . Tobacco abuse   . Normal pregnancy in multigravida in third trimester 10/15/2014   Past Surgical History  Procedure Laterality Date  . No past surgeries     Family History: family history includes Diabetes in her paternal grandmother; Heart Problems in her maternal grandfather and paternal aunt; Hypertension in her paternal grandmother. Social History:  reports that she quit smoking about 8 months ago. Her smoking use included Cigarettes. She smoked 0.50 packs per day. She has never used smokeless tobacco. 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   HENT: Negative.   Eyes: Negative.   Respiratory: Negative.   Cardiovascular: Negative.   Gastrointestinal: Negative.   Genitourinary:  Negative.   Musculoskeletal: Negative.   Skin: Negative.   Neurological: Negative.   Psychiatric/Behavioral: Negative.       Last menstrual period 10/27/2013. Maternal Exam:  Abdomen: Patient reports no abdominal tenderness. Fundal height is appropriate for gestation.   Estimated fetal weight is 8#.   Fetal presentation: vertex  Introitus: Normal vulva. Normal vagina.  Pelvis: adequate for delivery.   Cervix: Cervix evaluated by digital exam.     Physical Exam  Constitutional: She is oriented to person, place, and time. She appears well-developed and well-nourished.  HENT:  Head: Normocephalic and atraumatic.  Cardiovascular: Normal rate and regular rhythm.   Respiratory: Effort normal and breath sounds normal. No respiratory distress. She has no wheezes.  GI: Soft. Bowel sounds are normal. She exhibits no distension. There is no tenderness.  Musculoskeletal: Normal range of motion.  Neurological: She is alert and oriented to person, place, and time.  Skin: Skin is warm and dry.  Psychiatric: She has a normal mood and affect. Her behavior is normal.    Prenatal labs: ABO, Rh: O/Positive/-- (10/09 0000) Antibody: Negative (10/09 0000) Rubella: Nonimmune (10/09 0000) RPR: Nonreactive (10/09 0000)  HBsAg: Negative (10/09 0000)  HIV: Non-reactive (10/09 0000)  GBS: Positive (03/07 0000)  CF neg/ Hgb 12.7/Plt 187K/Ur Cx neg/Varicella nonimmune/First Tri Scr WNL/glucola 64/  Nl anat,post plac, female Tdap 07/28/14 Flu 06/30/14 Assessment/Plan: 23yo G2P1001 at 39+ for IOL PCN for gbbs prophylaxis Pitocin and AROM after 4 hrs for IOL Expect SVD Epidural PRN RNI IJ PP   Bovard-Stuckert, Chyanna Flock 10/15/2014,  9:57 PM

## 2014-10-16 ENCOUNTER — Inpatient Hospital Stay (HOSPITAL_COMMUNITY): Payer: Medicaid Other | Admitting: Anesthesiology

## 2014-10-16 ENCOUNTER — Encounter (HOSPITAL_COMMUNITY): Payer: Self-pay

## 2014-10-16 ENCOUNTER — Inpatient Hospital Stay (HOSPITAL_COMMUNITY)
Admission: RE | Admit: 2014-10-16 | Discharge: 2014-10-17 | DRG: 775 | Disposition: A | Discharge status: Home / Self Care | Payer: Medicaid Other | Source: Ambulatory Visit | Attending: Obstetrics and Gynecology | Admitting: Obstetrics and Gynecology

## 2014-10-16 VITALS — BP 107/66 | HR 100 | Temp 98.9°F | Resp 18 | Ht 67.0 in | Wt 171.0 lb

## 2014-10-16 DIAGNOSIS — Z8249 Family history of ischemic heart disease and other diseases of the circulatory system: Secondary | ICD-10-CM

## 2014-10-16 DIAGNOSIS — I471 Supraventricular tachycardia: Secondary | ICD-10-CM | POA: Diagnosis present

## 2014-10-16 DIAGNOSIS — Z3A39 39 weeks gestation of pregnancy: Secondary | ICD-10-CM | POA: Diagnosis present

## 2014-10-16 DIAGNOSIS — Z87891 Personal history of nicotine dependence: Secondary | ICD-10-CM | POA: Diagnosis not present

## 2014-10-16 DIAGNOSIS — Z3480 Encounter for supervision of other normal pregnancy, unspecified trimester: Secondary | ICD-10-CM

## 2014-10-16 DIAGNOSIS — O99824 Streptococcus B carrier state complicating childbirth: Secondary | ICD-10-CM | POA: Diagnosis present

## 2014-10-16 DIAGNOSIS — O9989 Other specified diseases and conditions complicating pregnancy, childbirth and the puerperium: Secondary | ICD-10-CM | POA: Diagnosis present

## 2014-10-16 DIAGNOSIS — Z3483 Encounter for supervision of other normal pregnancy, third trimester: Secondary | ICD-10-CM

## 2014-10-16 DIAGNOSIS — Z833 Family history of diabetes mellitus: Secondary | ICD-10-CM

## 2014-10-16 LAB — CBC
HCT: 37.9 % (ref 36.0–46.0)
HEMOGLOBIN: 13.2 g/dL (ref 12.0–15.0)
MCH: 31.2 pg (ref 26.0–34.0)
MCHC: 34.8 g/dL (ref 30.0–36.0)
MCV: 89.6 fL (ref 78.0–100.0)
Platelets: 133 10*3/uL — ABNORMAL LOW (ref 150–400)
RBC: 4.23 MIL/uL (ref 3.87–5.11)
RDW: 13.9 % (ref 11.5–15.5)
WBC: 9.6 10*3/uL (ref 4.0–10.5)

## 2014-10-16 LAB — TYPE AND SCREEN
ABO/RH(D): O POS
Antibody Screen: NEGATIVE

## 2014-10-16 LAB — ABO/RH: ABO/RH(D): O POS

## 2014-10-16 MED ORDER — DIPHENHYDRAMINE HCL 25 MG PO CAPS: 25.0000 mg | ORAL_CAPSULE | Freq: Four times a day (QID) | ORAL | Status: DC | PRN

## 2014-10-16 MED ORDER — OXYCODONE-ACETAMINOPHEN 5-325 MG PO TABS: 2.0000 | ORAL_TABLET | ORAL | Status: DC | PRN

## 2014-10-16 MED ORDER — BUTORPHANOL TARTRATE 1 MG/ML IJ SOLN: 1.0000 mg | INTRAMUSCULAR | Status: DC | PRN

## 2014-10-16 MED ORDER — LIDOCAINE HCL (PF) 1 % IJ SOLN
INTRAMUSCULAR | Status: DC | PRN
  Administered 2014-10-16 (×2): 5 mL

## 2014-10-16 MED ORDER — SIMETHICONE 80 MG PO CHEW: 80.0000 mg | CHEWABLE_TABLET | ORAL | Status: DC | PRN

## 2014-10-16 MED ORDER — LACTATED RINGERS IV SOLN
500.0000 mL | Freq: Once | INTRAVENOUS | Status: AC
  Administered 2014-10-16: 500 mL via INTRAVENOUS

## 2014-10-16 MED ORDER — ONDANSETRON HCL 4 MG PO TABS: 4.0000 mg | ORAL_TABLET | ORAL | Status: DC | PRN

## 2014-10-16 MED ORDER — DIBUCAINE 1 % RE OINT
1.0000 "application " | TOPICAL_OINTMENT | RECTAL | Status: DC | PRN
  Administered 2014-10-16: 1 via RECTAL
  Filled 2014-10-16: qty 28

## 2014-10-16 MED ORDER — ONDANSETRON HCL 4 MG/2ML IJ SOLN: 4.0000 mg | Freq: Four times a day (QID) | INTRAMUSCULAR | Status: DC | PRN

## 2014-10-16 MED ORDER — LACTATED RINGERS IV SOLN
INTRAVENOUS | Status: DC
  Administered 2014-10-16 (×2): via INTRAVENOUS

## 2014-10-16 MED ORDER — OXYTOCIN 40 UNITS IN LACTATED RINGERS INFUSION - SIMPLE MED: 62.5000 mL/h | INTRAVENOUS | Status: DC

## 2014-10-16 MED ORDER — PRENATAL MULTIVITAMIN CH: 1.0000 | ORAL_TABLET | Freq: Every day | ORAL | Status: DC

## 2014-10-16 MED ORDER — EPHEDRINE 5 MG/ML INJ
10.0000 mg | INTRAVENOUS | Status: DC | PRN
  Filled 2014-10-16: qty 2

## 2014-10-16 MED ORDER — WITCH HAZEL-GLYCERIN EX PADS
1.0000 "application " | MEDICATED_PAD | CUTANEOUS | Status: DC | PRN
  Administered 2014-10-16: 1 via TOPICAL

## 2014-10-16 MED ORDER — LIDOCAINE HCL (PF) 1 % IJ SOLN
30.0000 mL | INTRAMUSCULAR | Status: DC | PRN
  Filled 2014-10-16: qty 30

## 2014-10-16 MED ORDER — ACETAMINOPHEN 325 MG PO TABS: 650.0000 mg | ORAL_TABLET | ORAL | Status: DC | PRN

## 2014-10-16 MED ORDER — OXYTOCIN BOLUS FROM INFUSION
500.0000 mL | INTRAVENOUS | Status: DC
Start: 2014-10-16 — End: 2014-10-16

## 2014-10-16 MED ORDER — PENICILLIN G POTASSIUM 5000000 UNITS IJ SOLR
2.5000 10*6.[IU] | INTRAVENOUS | Status: DC
  Administered 2014-10-16 (×2): 2.5 10*6.[IU] via INTRAVENOUS
  Filled 2014-10-16 (×5): qty 2.5

## 2014-10-16 MED ORDER — MEASLES, MUMPS & RUBELLA VAC ~~LOC~~ INJ
0.5000 mL | INJECTION | Freq: Once | SUBCUTANEOUS | Status: AC
  Administered 2014-10-17: 0.5 mL via SUBCUTANEOUS
  Filled 2014-10-16: qty 0.5

## 2014-10-16 MED ORDER — BENZOCAINE-MENTHOL 20-0.5 % EX AERO
1.0000 | INHALATION_SPRAY | CUTANEOUS | Status: DC | PRN
Start: 2014-10-16 — End: 2014-10-17
  Administered 2014-10-17: 1 via TOPICAL
  Filled 2014-10-16: qty 56

## 2014-10-16 MED ORDER — FENTANYL 2.5 MCG/ML BUPIVACAINE 1/10 % EPIDURAL INFUSION (WH - ANES)
14.0000 mL/h | INTRAMUSCULAR | Status: DC | PRN
  Administered 2014-10-16: 14 mL/h via EPIDURAL
  Filled 2014-10-16: qty 125

## 2014-10-16 MED ORDER — PHENYLEPHRINE 40 MCG/ML (10ML) SYRINGE FOR IV PUSH (FOR BLOOD PRESSURE SUPPORT)
80.0000 ug | PREFILLED_SYRINGE | INTRAVENOUS | Status: DC | PRN
  Filled 2014-10-16: qty 20
  Filled 2014-10-16: qty 2

## 2014-10-16 MED ORDER — OXYCODONE-ACETAMINOPHEN 5-325 MG PO TABS
1.0000 | ORAL_TABLET | ORAL | Status: DC | PRN
  Administered 2014-10-17: 1 via ORAL
  Filled 2014-10-16: qty 1

## 2014-10-16 MED ORDER — OXYCODONE-ACETAMINOPHEN 5-325 MG PO TABS: 1.0000 | ORAL_TABLET | ORAL | Status: DC | PRN

## 2014-10-16 MED ORDER — LACTATED RINGERS IV SOLN: 500.0000 mL | INTRAVENOUS | Status: DC | PRN

## 2014-10-16 MED ORDER — IBUPROFEN 600 MG PO TABS
600.0000 mg | ORAL_TABLET | Freq: Four times a day (QID) | ORAL | Status: DC
  Filled 2014-10-16: qty 1

## 2014-10-16 MED ORDER — PHENYLEPHRINE 40 MCG/ML (10ML) SYRINGE FOR IV PUSH (FOR BLOOD PRESSURE SUPPORT)
80.0000 ug | PREFILLED_SYRINGE | INTRAVENOUS | Status: DC | PRN
  Filled 2014-10-16: qty 2

## 2014-10-16 MED ORDER — OXYTOCIN 40 UNITS IN LACTATED RINGERS INFUSION - SIMPLE MED
1.0000 m[IU]/min | INTRAVENOUS | Status: DC
  Administered 2014-10-16: 2 m[IU]/min via INTRAVENOUS
  Filled 2014-10-16: qty 1000

## 2014-10-16 MED ORDER — TERBUTALINE SULFATE 1 MG/ML IJ SOLN
0.2500 mg | Freq: Once | INTRAMUSCULAR | Status: DC | PRN
  Filled 2014-10-16: qty 1

## 2014-10-16 MED ORDER — CITRIC ACID-SODIUM CITRATE 334-500 MG/5ML PO SOLN: 30.0000 mL | ORAL | Status: DC | PRN

## 2014-10-16 MED ORDER — ONDANSETRON HCL 4 MG/2ML IJ SOLN: 4.0000 mg | INTRAMUSCULAR | Status: DC | PRN

## 2014-10-16 MED ORDER — LANOLIN HYDROUS EX OINT: TOPICAL_OINTMENT | CUTANEOUS | Status: DC | PRN

## 2014-10-16 MED ORDER — SENNOSIDES-DOCUSATE SODIUM 8.6-50 MG PO TABS
2.0000 | ORAL_TABLET | ORAL | Status: DC
  Filled 2014-10-16: qty 2

## 2014-10-16 MED ORDER — DIPHENHYDRAMINE HCL 50 MG/ML IJ SOLN: 12.5000 mg | INTRAMUSCULAR | Status: DC | PRN

## 2014-10-16 MED ORDER — ZOLPIDEM TARTRATE 5 MG PO TABS: 5.0000 mg | ORAL_TABLET | Freq: Every evening | ORAL | Status: DC | PRN

## 2014-10-16 MED ORDER — LACTATED RINGERS IV SOLN: INTRAVENOUS | Status: DC

## 2014-10-16 MED ORDER — PENICILLIN G POTASSIUM 5000000 UNITS IJ SOLR
5.0000 10*6.[IU] | Freq: Once | INTRAMUSCULAR | Status: AC
  Administered 2014-10-16: 5 10*6.[IU] via INTRAVENOUS
  Filled 2014-10-16: qty 5

## 2014-10-16 NOTE — Progress Notes (Signed)
Patient ID: Angela Guzman, female   DOB: February 21, 1991, 24 y.o.   MRN: [id number]  No c/o's.  Comfortable with epidural  AFVSS gen NAD FHTs 130's, mod var, category 1 toco q 2  SVE 4/70/-2 AROM for clear fluid  Continue IOL

## 2014-10-16 NOTE — Anesthesia Procedure Notes (Signed)
Epidural Patient location during procedure: OB Start time: 10/16/2014 1:31 PM  Staffing Anesthesiologist: Rudean Curt Performed by: anesthesiologist   Preanesthetic Checklist Completed: patient identified, site marked, surgical consent, pre-op evaluation, timeout performed, IV checked, risks and benefits discussed and monitors and equipment checked  Epidural Patient position: sitting Prep: site prepped and draped and DuraPrep Patient monitoring: continuous pulse ox and blood pressure Approach: midline Location: L3-L4 Injection technique: LOR air  Needle:  Needle type: Tuohy  Needle gauge: 17 G Needle length: 9 cm and 9 Needle insertion depth: 5 cm cm Catheter type: closed end flexible Catheter size: 19 Gauge Catheter at skin depth: 10 cm Test dose: negative  Assessment Events: blood not aspirated, injection not painful, no injection resistance, negative IV test and no paresthesia  Additional Notes Patient identified.  Risk benefits discussed including failed block, incomplete pain control, headache, nerve damage, paralysis, blood pressure changes, nausea, vomiting, reactions to medication both toxic or allergic, and postpartum back pain.  Patient expressed understanding and wished to proceed.  All questions were answered.  Sterile technique used throughout procedure and epidural site dressed with sterile barrier dressing. No paresthesia or other complications noted.The patient did not experience any signs of intravascular injection such as tinnitus or metallic taste in mouth nor signs of intrathecal spread such as rapid motor block. Please see nursing notes for vital signs.

## 2014-10-16 NOTE — Anesthesia Preprocedure Evaluation (Signed)
Anesthesia Evaluation  Patient identified by MRN, date of birth, ID band Patient awake    Reviewed: Allergy & Precautions, H&P , Patient's Chart, lab work & pertinent test results  Airway Mallampati: II  TM Distance: >3 FB Neck ROM: full    Dental   Pulmonary former smoker,  breath sounds clear to auscultation        Cardiovascular Supra Ventricular Tachycardia Rhythm:regular Rate:Normal     Neuro/Psych    GI/Hepatic   Endo/Other    Renal/GU      Musculoskeletal   Abdominal   Peds  Hematology   Anesthesia Other Findings SVT- none recently--- last time right after delivery  Reproductive/Obstetrics (+) Pregnancy                             Anesthesia Physical Anesthesia Plan  ASA: II  Anesthesia Plan: Epidural   Post-op Pain Management:    Induction:   Airway Management Planned:   Additional Equipment:   Intra-op Plan:   Post-operative Plan:   Informed Consent: I have reviewed the patients History and Physical, chart, labs and discussed the procedure including the risks, benefits and alternatives for the proposed anesthesia with the patient or authorized representative who has indicated his/her understanding and acceptance.     Plan Discussed with:   Anesthesia Plan Comments:         Anesthesia Quick Evaluation

## 2014-10-16 NOTE — Progress Notes (Signed)
Patient ID: Angela Guzman, female   DOB: 1990/12/18, 24 y.o.   MRN: [id number]  D/w pt POC for IOL.   FHTs 120-130's, good var, category 1 toco q 2-51min

## 2014-10-17 LAB — CBC
HCT: 34.5 % — ABNORMAL LOW (ref 36.0–46.0)
Hemoglobin: 11.6 g/dL — ABNORMAL LOW (ref 12.0–15.0)
MCH: 30.6 pg (ref 26.0–34.0)
MCHC: 33.6 g/dL (ref 30.0–36.0)
MCV: 91 fL (ref 78.0–100.0)
Platelets: 130 10*3/uL — ABNORMAL LOW (ref 150–400)
RBC: 3.79 MIL/uL — ABNORMAL LOW (ref 3.87–5.11)
RDW: 14 % (ref 11.5–15.5)
WBC: 10.9 10*3/uL — ABNORMAL HIGH (ref 4.0–10.5)

## 2014-10-17 LAB — CCBB MATERNAL DONOR DRAW

## 2014-10-17 LAB — RPR: RPR Ser Ql: NONREACTIVE

## 2014-10-17 MED ORDER — OXYCODONE-ACETAMINOPHEN 5-325 MG PO TABS: 1.0000 | ORAL_TABLET | Freq: Four times a day (QID) | ORAL | Status: DC | PRN

## 2014-10-17 MED ORDER — FLINTSTONES COMPLETE 60 MG PO CHEW: 1.0000 | CHEWABLE_TABLET | Freq: Two times a day (BID) | ORAL | Status: DC

## 2014-10-17 MED ORDER — POLYETHYLENE GLYCOL 3350 17 G PO PACK
17.0000 g | PACK | Freq: Every day | ORAL | Status: DC
Start: 2014-10-17 — End: 2014-10-17
  Administered 2014-10-17: 17 g via ORAL
  Filled 2014-10-17 (×3): qty 1

## 2014-10-17 MED ORDER — IBUPROFEN 100 MG/5ML PO SUSP: 600.0000 mg | Freq: Four times a day (QID) | ORAL | Status: DC

## 2014-10-17 MED ORDER — IBUPROFEN 100 MG/5ML PO SUSP
600.0000 mg | Freq: Four times a day (QID) | ORAL | Status: DC
  Administered 2014-10-17 (×3): 600 mg via ORAL
  Filled 2014-10-17 (×7): qty 30

## 2014-10-17 NOTE — Discharge Summary (Signed)
Obstetric Discharge Summary Reason for Admission: induction of labor Prenatal Procedures: none Intrapartum Procedures: spontaneous vaginal delivery Postpartum Procedures: none Complications-Operative and Postpartum: none HEMOGLOBIN  Date Value Ref Range Status  10/16/2014 13.2 12.0 - 15.0 g/dL Final   HCT  Date Value Ref Range Status  10/16/2014 37.9 36.0 - 46.0 % Final    Physical Exam:  General: alert and no distress Lochia: appropriate Uterine Fundus: firm  Discharge Diagnoses: Term Pregnancy-delivered  Discharge Information: Date: 10/17/2014 Activity: pelvic rest Diet: routine Medications: PNV, Ibuprofen and Percocet Condition: stable Instructions: refer to practice specific booklet Discharge to: home Follow-up Information    Follow up with Bovard-Stuckert, Thinh Cuccaro, MD. Schedule an appointment as soon as possible for a visit in 6 weeks.   Specialty:  Obstetrics and Gynecology   Why:  post-partum check   Contact information:   [address] [place] [address] [phone]       Newborn Data: Live born female  Birth Weight: 7 lb 2.5 oz (3245 g) APGAR: 9, 9  Home with mother.  Bovard-Stuckert, Loring Liskey 10/17/2014, 6:28 AM

## 2014-10-17 NOTE — Anesthesia Postprocedure Evaluation (Signed)
  Anesthesia Post-op Note  Patient: Angela Guzman  Procedure(s) Performed: * No procedures listed *  Patient Location: Mother/Baby  Anesthesia Type:Epidural  Level of Consciousness: awake, alert , oriented and patient cooperative  Airway and Oxygen Therapy: Patient Spontanous Breathing  Post-op Pain: none  Post-op Assessment: Post-op Vital signs reviewed, Patient's Cardiovascular Status Stable, Respiratory Function Stable, Patent Airway, No headache, No backache, No residual numbness and No residual motor weakness  Post-op Vital Signs: Reviewed and stable  Last Vitals:  Filed Vitals:   10/17/14 0135  BP: 112/64  Pulse: 97  Temp: 36.5 C  Resp:     Complications: No apparent anesthesia complications

## 2014-10-17 NOTE — Progress Notes (Addendum)
Post Partum Day 1 Subjective: no complaints, up ad lib, voiding, tolerating PO and nl lochia, pain controlled  Objective: Blood pressure 112/64, pulse 97, temperature 97.7 F (36.5 C), temperature source Oral, resp. rate 18, height 5\' 7"  (1.702 m), weight 77.565 kg (171 lb), last menstrual period 10/27/2013, unknown if currently breastfeeding.  Physical Exam:  General: alert and no distress Lochia: appropriate Uterine Fundus: firm   Recent Labs  10/16/14 0746  HGB 13.2  HCT 37.9    Assessment/Plan: Plan for discharge tomorrow.  Routine care.  Pt desires early d/c, aware can't be d/c until 5:30pm and dependent on baby's d/c.  D/C with Motrin, percocet and PNV.  F/u 6 weeks   LOS: 1 day   Guzman, Angela Spurlock 10/17/2014, 5:56 AM

## 2014-10-22 NOTE — Addendum Note (Signed)
Addendum  created 10/22/14 1229 by Rudean Curt, MD   Modules edited: Anesthesia Responsible Staff

## 2015-03-12 IMAGING — CR DG CHEST 2V
2 series · 2 of 2 positions shown · non-contrast
Comparison: DG CHEST 2 VIEW dated 12/21/2010

CLINICAL DATA: Tachycardia

EXAM:
CHEST - 2 VIEW

[w chest pa]
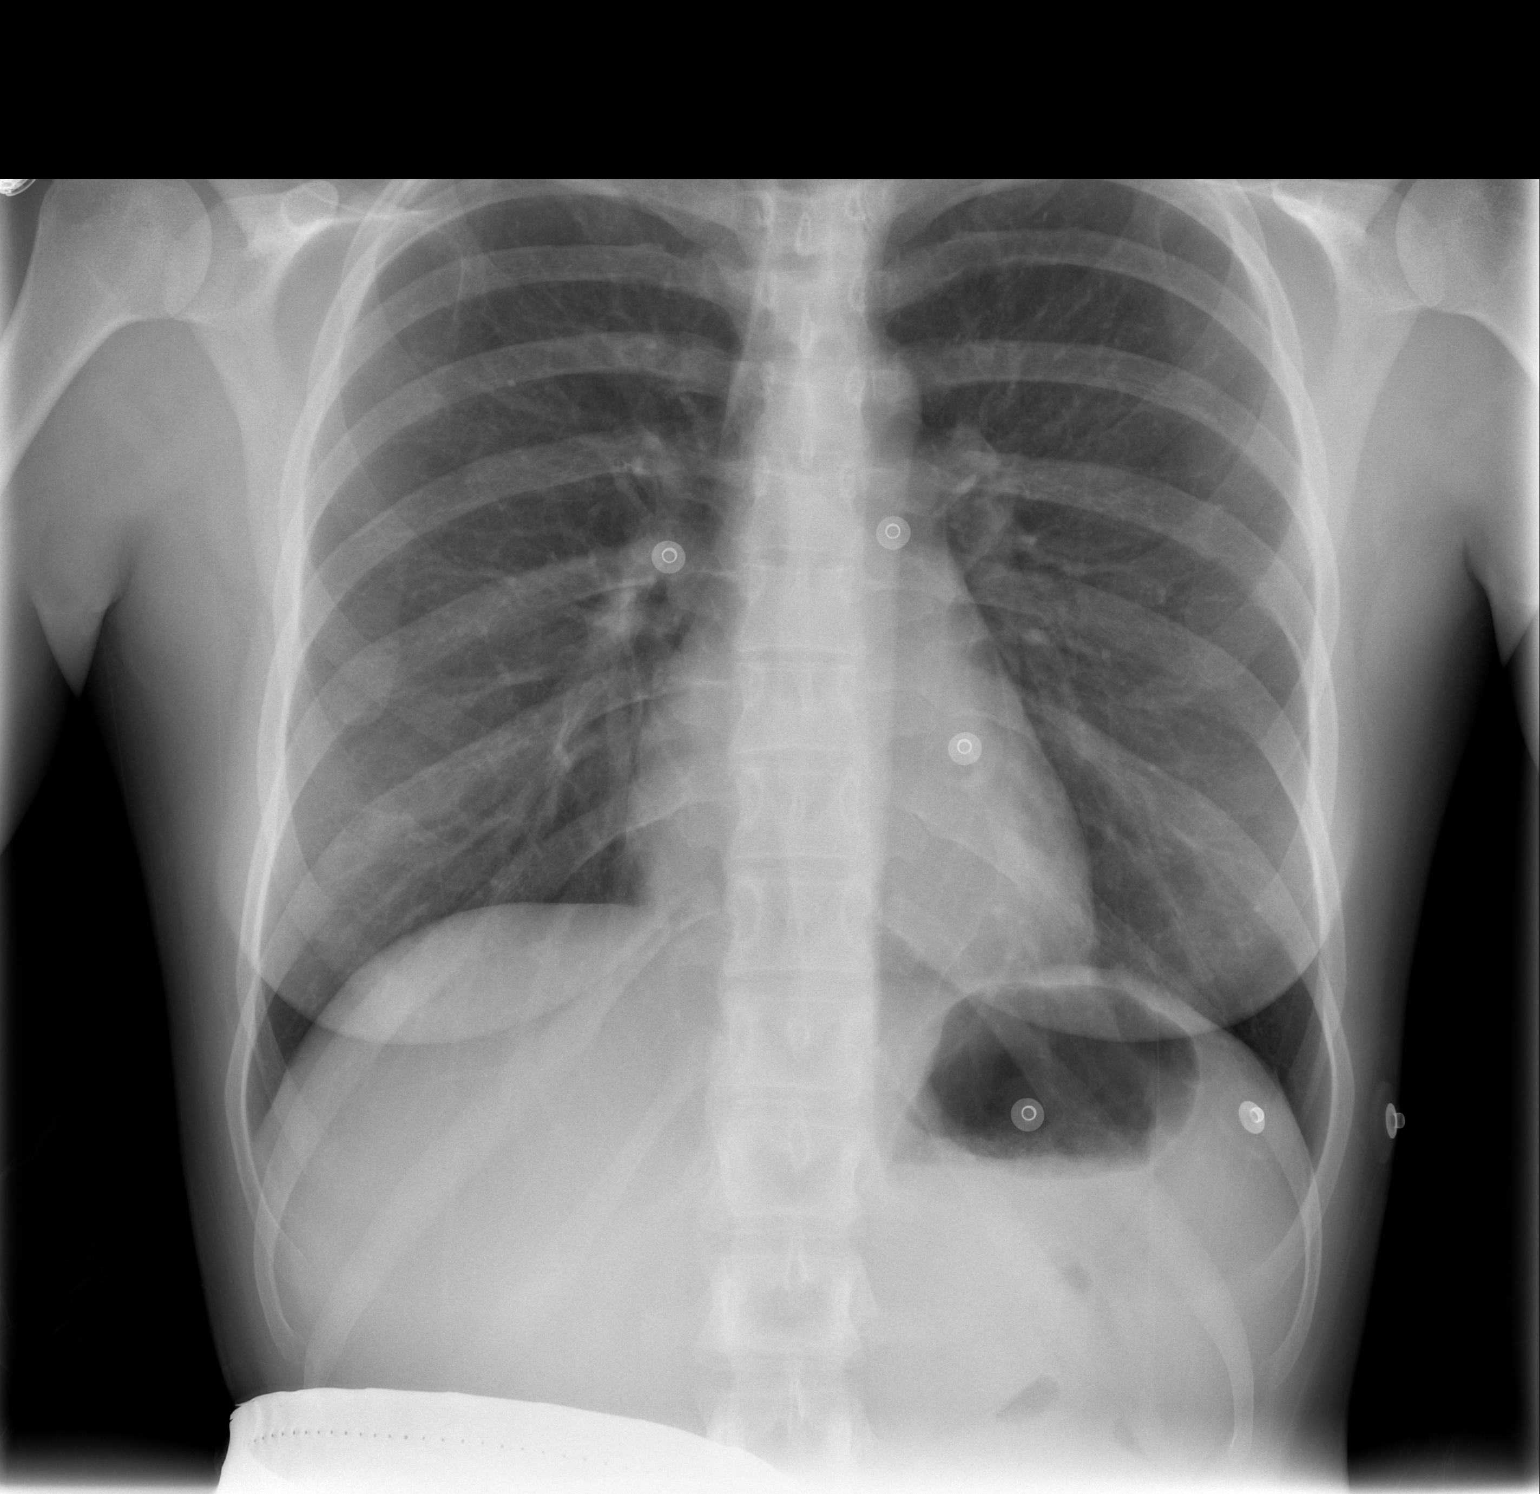

[w chest lat]
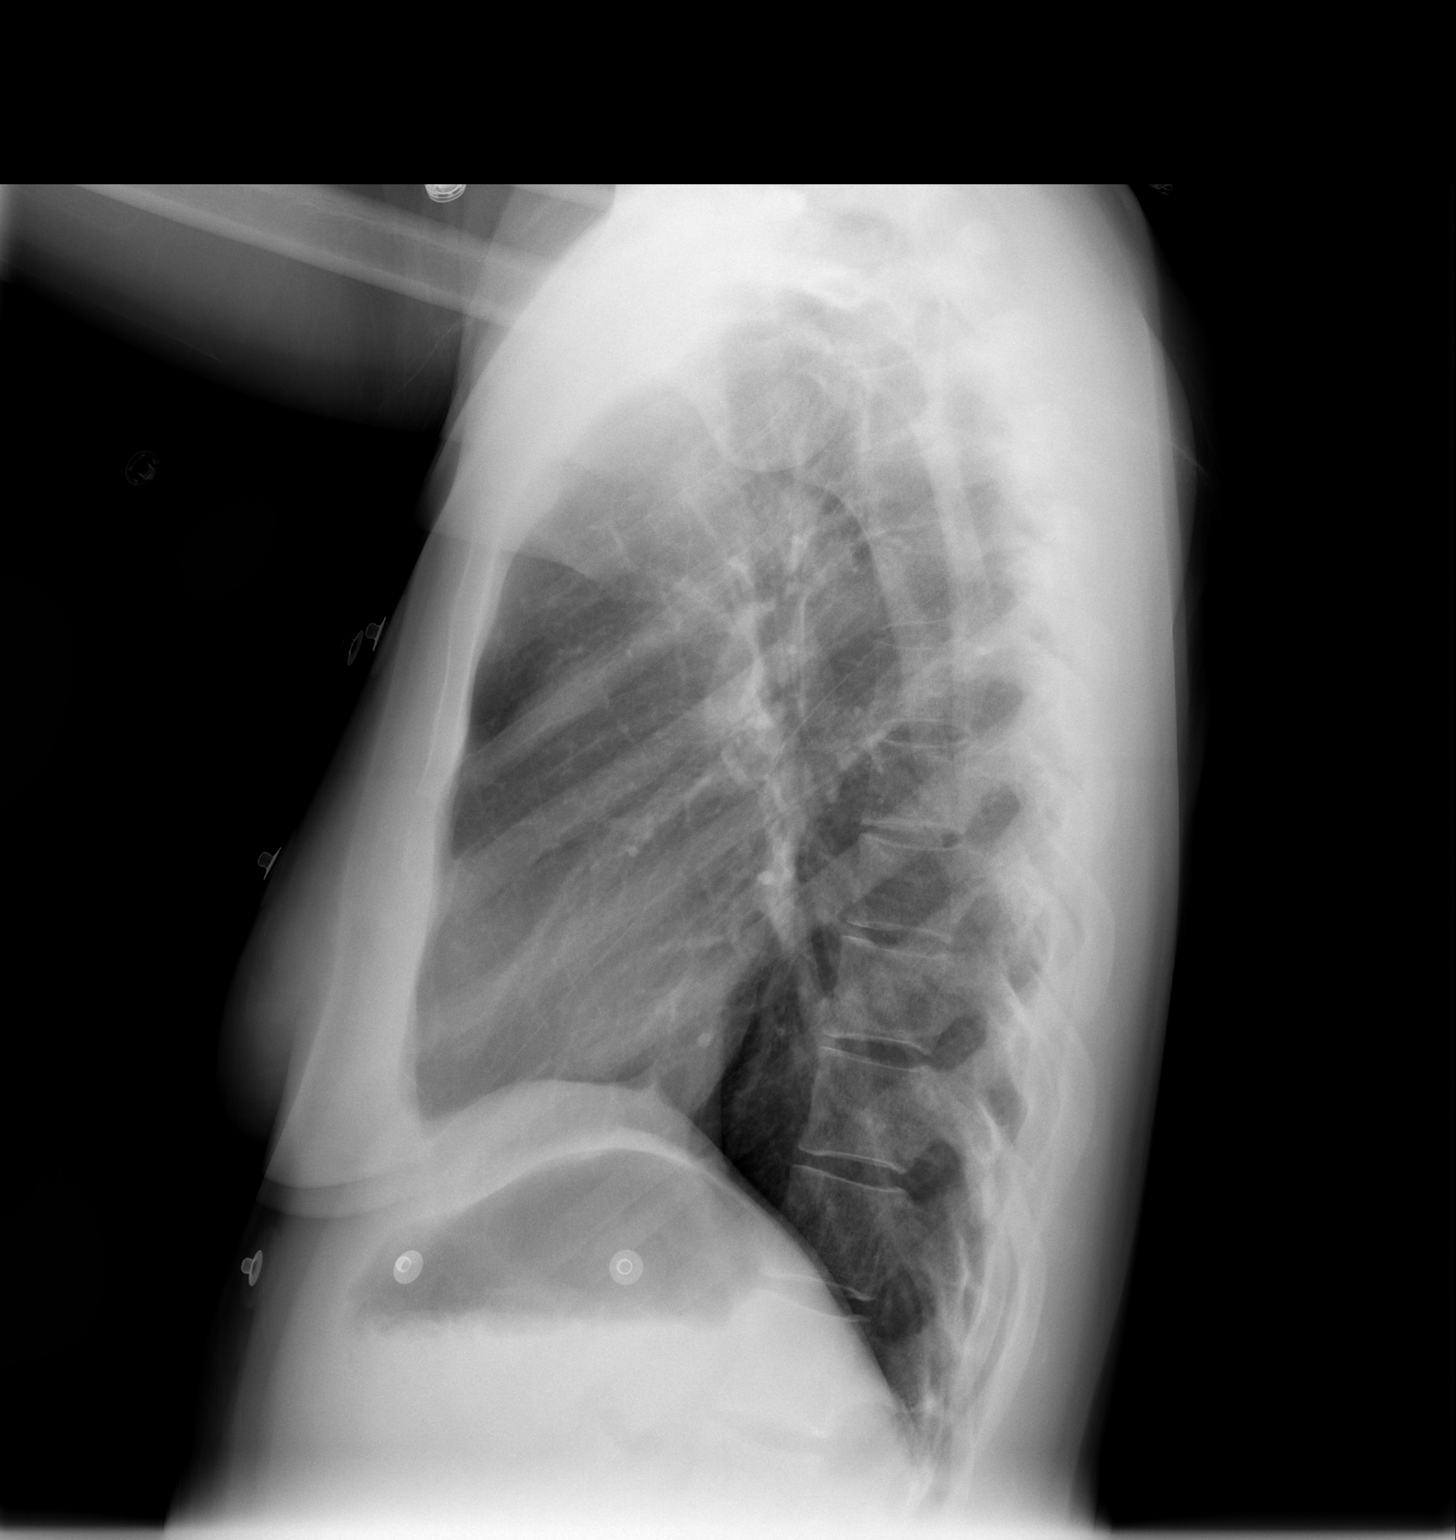

[2 of 2 positions shown; findings below may reference images not displayed]

FINDINGS: Normal heart size.  Clear lungs.
IMPRESSION: Negative.

## 2015-06-26 ENCOUNTER — Encounter (HOSPITAL_COMMUNITY): Payer: Self-pay | Admitting: Emergency Medicine

## 2015-06-26 ENCOUNTER — Emergency Department (HOSPITAL_COMMUNITY)
Admission: EM | Admit: 2015-06-26 | Discharge: 2015-06-26 | Disposition: A | Discharge status: Home / Self Care | Payer: Medicaid Other | Attending: Emergency Medicine | Admitting: Emergency Medicine

## 2015-06-26 DIAGNOSIS — Z87440 Personal history of urinary (tract) infections: Secondary | ICD-10-CM | POA: Insufficient documentation

## 2015-06-26 DIAGNOSIS — I471 Supraventricular tachycardia: Secondary | ICD-10-CM | POA: Insufficient documentation

## 2015-06-26 DIAGNOSIS — R5383 Other fatigue: Secondary | ICD-10-CM | POA: Insufficient documentation

## 2015-06-26 DIAGNOSIS — Z87891 Personal history of nicotine dependence: Secondary | ICD-10-CM | POA: Insufficient documentation

## 2015-06-26 NOTE — Discharge Instructions (Signed)
Paroxysmal Supraventricular Tachycardia Paroxysmal supraventricular tachycardia (PSVT) is a type of abnormal heart rhythm. It causes your heart to beat very quickly and then suddenly stop beating so quickly. A normal heart rate is 60-100 beats per minute. During an episode of PSVT, your heart rate may be 150-250 beats per minute. This can make you feel light-headed and short of breath. An episode of PSVT can be frightening. It is usually not dangerous. The heart has four chambers. All chambers need to work together for the heart to beat effectively. A normal heartbeat usually starts in the right upper chamber of the heart (atrium) when an area (sinoatrial node) puts out an electrical signal that spreads to the other chambers. People with PSVT may have abnormal electrical pathways, or they may have other areas in the upper chambers that send out electrical signals. The result is a very rapid heartbeat. When your heart beats very quickly, it does not have time to fill completely with blood. When PSVT happens often or it lasts for long periods, it can lead to heart weakness and failure. Most people with PSVT do not have any other heart disease. CAUSES Abnormal electrical activity in the heart causes PSVT. It is not known why some people get PSVT and others do not. RISK FACTORS You may be more likely to have PSVT if:  You are 20-30 years old.  You are a woman. Other factors that may increase your chances of an attack include:  Stress.  Being tired.  Smoking.  Stimulant drugs.  Alcoholic drinks.  Caffeine.  Pregnancy. SIGNS AND SYMPTOMS A mild episode of PSVT may cause no symptoms. If you do have signs and symptoms, they may include:  A pounding heart.  Feeling of skipped heartbeats (palpitations).  Weakness.  Shortness of breath.  Tightness or pain in your chest.  Light-headedness.  Anxiety.  Dizziness.  Sweating.  Nausea.  A fainting spell. DIAGNOSIS Your health care  provider may suspect PSVT if you have symptoms that come and go. The health care provider will do a physical exam. If you are having an episode during the exam, the health care provider may be able to diagnose PSVT by listening to your heart and feeling your pulse. Tests may also be done, including:  An electrical study of your heart (electrocardiogram, or ECG).  A test in which you wear a portable ECG monitor all day (Holter monitor) or for several days (event monitor).  A test that involves taking an image of your heart using sound waves (echocardiogram) to rule out other causes of a fast heart rate. TREATMENT You may not need treatment if episodes of PSVT do not happen often or if they do not cause symptoms. If PSVT episodes do cause symptoms, your health care provider may first suggest trying a self-treatment called vagus nerve stimulation. The vagus nerve extends down from the brain. It regulates certain body functions. Stimulating this nerve can slow down the heart. Your health care provider can teach you ways to do this. You may need to try a few ways to find what works best for you. Options include:  Holding your breath and pushing, as though you are having a bowel movement.  Massaging an area on one side of your neck below your jaw.  Bending forward with your head between your legs.  Bending forward with your head between your legs and coughing.  Massaging your eyeballs with your eyes closed. If vagus nerve stimulation does not work, other treatment options include:    Medicines to prevent an attack.  Being treated in the hospital with medicine or electric shock to stop an attack (cardioversion). This treatment can include:  Getting medicine through an IV line.  Having a small electric shock delivered to your heart. You will be given medicine to make you sleep through this procedure.  If you have frequent episodes with symptoms, you may need a procedure to get rid of the faulty  areas of your heart (radiofrequency ablation) and end the episodes of PSVT. In this procedure:  A long, thin tube (catheter) is passed through one of your veins into your heart.  Energy directed through the catheter eliminates the areas of your heart that are causing abnormal electric stimulation. HOME CARE INSTRUCTIONS  Take medicines only as directed by your health care provider.  Do not use caffeine in any form if caffeine triggers episodes of PSVT. Otherwise, consume caffeine in moderation. This means no more than a few cups of coffee or the equivalent each day.  Do not drink alcohol if alcohol triggers episodes of PSVT. Otherwise, limit alcohol intake to no more than 1 drink per day for nonpregnant women and 2 drinks per day for men. One drink equals 12 ounces of beer, 5 ounces of wine, or 1 ounces of hard liquor.  Do not use any tobacco products, including cigarettes, chewing tobacco, or electronic cigarettes. If you need help quitting, ask your health care provider.  Try to get at least 7 hours of sleep each night.  Find healthy ways to manage stress.  Perform vagus nerve stimulation as directed by your health care provider.  Maintain a healthy weight.  Get some exercise on most days. Ask your health care provider to suggest some good activities for you. SEEK MEDICAL CARE IF:  You are having episodes of PSVT more often, or they are lasting longer.  Vagus nerve stimulation is no longer helping.  You have new symptoms during an episode. SEEK IMMEDIATE MEDICAL CARE IF:  You have chest pain or trouble breathing.  You have an episode of PSVT that has lasted longer than 20 minutes.  You have passed out from an episode of PSVT. These symptoms may represent a serious problem that is an emergency. Do not wait to see if the symptoms will go away. Get medical help right away. Call your local emergency services (911 in the [place] Do not drive yourself to the hospital.   This  information is not intended to replace advice given to you by your health care provider. Make sure you discuss any questions you have with your health care provider.   Document Released: 07/04/2005 Document Revised: 07/25/2014 Document Reviewed: 12/12/2013 Elsevier Interactive Patient Education 2016 [employer]  

## 2015-06-26 NOTE — ED Provider Notes (Signed)
CSN: [id number]     Arrival date & time 06/26/15  [id number] History   First MD Initiated Contact with Patient 06/26/15 [id number]     Chief Complaint  Patient presents with  . Tachycardia     (Consider location/radiation/quality/duration/timing/severity/associated sxs/prior Treatment) HPI Patient has known history of SVT. She reports she awoke feeling well this morning, she had been up for a while and lay back down to wait for her child to awaken. She reports after she lay down her heart started beating really fast and hard. She states she has had SVT before so she tried taking a shower, bearing down and she took a dose of metoprolol. These things did not seem to help. She was starting to get tightness in her chest and presented to the emergency department. She reports just after getting to the emergency department and when they started putting the monitors on her, it seemed to resolve. She states now she feels better and just has some fatigue. She reports she has been instructed to take the metoprolol daily but it makes her feel fatigued and short of breath so she takes it on an as-needed basis. She is not been ill otherwise, no other fever, cough, lower extremity swelling. She reports she has had a cardiac echo in the past and worn an event monitor. Past Medical History  Diagnosis Date  . UTI (lower urinary tract infection)   . SVT (supraventricular tachycardia) ([place])   . Allergic rhinitis   . Tobacco abuse   . Normal pregnancy in multigravida in third trimester 10/15/2014  . SVD (spontaneous vaginal delivery) 10/16/2014   Past Surgical History  Procedure Laterality Date  . No past surgeries     Family History  Problem Relation Age of Onset  . Hypertension Paternal Grandmother   . Diabetes Paternal Grandmother   . Heart Problems Paternal Aunt   . Heart Problems Maternal Grandfather    Social History  Substance Use Topics  . Smoking status: Former Smoker -- 0.50 packs/day    Types: Cigarettes     Quit date: 01/15/2014  . Smokeless tobacco: Never Used  . Alcohol Use: No     Comment: occasionally   OB History    Gravida Para Term Preterm AB TAB SAB Ectopic Multiple Living   2 2 2  0 0 0 0 0 0 2     Review of Systems  10 Systems reviewed and are negative for acute change except as noted in the HPI.   Allergies  Review of patient's allergies indicates no known allergies.  Home Medications   Prior to Admission medications   Medication Sig Start Date End Date Taking? Authorizing Provider  ibuprofen (ADVIL,MOTRIN) 100 MG/5ML suspension Take 30 mLs (600 mg total) by mouth every 6 (six) hours. Patient taking differently: Take 600 mg by mouth every 6 (six) hours as needed (migraine).  10/17/14  Yes Jody Bovard-Stuckert, MD  flintstones complete (FLINTSTONES) 60 MG chewable tablet Chew 1 tablet by mouth 2 (two) times daily. Patient not taking: Reported on 06/26/2015 10/17/14   Janyth Contes, MD  oxyCODONE-acetaminophen (PERCOCET/ROXICET) 5-325 MG per tablet Take 1-2 tablets by mouth every 6 (six) hours as needed for severe pain. Patient not taking: Reported on 06/26/2015 10/17/14   Janyth Contes, MD   BP 129/91 mmHg  Pulse 104  Temp(Src) 97.7 F (36.5 C) (Oral)  Resp 16  Ht 5\' 7"  (1.702 m)  Wt 150 lb (68.04 kg)  BMI 23.49 kg/m2  SpO2 100% Physical Exam  Constitutional:  She is oriented to person, place, and time. She appears well-developed and well-nourished.  HENT:  Head: Normocephalic and atraumatic.  Eyes: EOM are normal. Pupils are equal, round, and reactive to light.  Neck: Neck supple.  Cardiovascular: Normal rate, regular rhythm, normal heart sounds and intact distal pulses.   Monitor shows sinus rhythm at 100. No ectopy.  Pulmonary/Chest: Effort normal and breath sounds normal.  Abdominal: Soft. Bowel sounds are normal. She exhibits no distension. There is no tenderness.  Musculoskeletal: Normal range of motion. She exhibits no edema or tenderness.   Neurological: She is alert and oriented to person, place, and time. She has normal strength. Coordination normal. GCS eye subscore is 4. GCS verbal subscore is 5. GCS motor subscore is 6.  Skin: Skin is warm, dry and intact.  Psychiatric: She has a normal mood and affect.    ED Course  Procedures (including critical care time) Labs Review Labs Reviewed - No data to display  Imaging Review No results found. I have personally reviewed and evaluated these images and lab results as part of my medical decision-making.   EKG Interpretation   Date/Time:  Friday June 26 2015 09:28:56 EST Ventricular Rate:  109 PR Interval:  146 QRS Duration: 82 QT Interval:  335 QTC Calculation: 451 R Axis:   101 Text Interpretation:  Sinus tachycardia Borderline right axis deviation  agree no change Confirmed by Johnney Killian, MD, Jeannie Done [phone]) on 06/26/2015  9:39:29 AM      MDM   Final diagnoses:  Paroxysmal SVT (supraventricular tachycardia) ([place])   Patient has a well-documented history of SVT. At this time history is consistent with paroxysmal SVT resolved now. Patient has already taken a dose of metoprolol at home. There are no other signs of illness or other pulmonary etiology. Patient's family history is for other members of the family with coronary artery disease onset in later years but no sudden death or early onset cardiac disease. At this point feel patient is safe for discharge with follow-up as needed.    Charlesetta Shanks, MD 06/26/15 1000

## 2015-06-26 NOTE — ED Notes (Signed)
sts hx of SVT and thinks she was in same this morning for 1 hour, ST at time of triage, mild CP, denies other complaints, NAD

## 2015-11-12 ENCOUNTER — Ambulatory Visit: Payer: Self-pay | Attending: Family Medicine | Admitting: Physician Assistant

## 2015-11-12 ENCOUNTER — Encounter: Payer: Self-pay | Admitting: Physician Assistant

## 2015-11-12 VITALS — BP 130/80 | HR 89 | Temp 98.0°F | Wt 166.6 lb

## 2015-11-12 DIAGNOSIS — D239 Other benign neoplasm of skin, unspecified: Secondary | ICD-10-CM

## 2015-11-12 DIAGNOSIS — I471 Supraventricular tachycardia, unspecified: Secondary | ICD-10-CM

## 2015-11-12 NOTE — Progress Notes (Signed)
Patient ID: Angela Guzman, female   DOB: [birth year], 25 y.o.   MRN: [id number]   Angela Guzman, is a 25 y.o. female  [id number]  [id number]  DOB - Apr 26, 1991  Chief Complaint  Patient presents with  . Mass    Bump x 2 left hip x 2-3 mths itchy        Subjective:  Chief Complaint and HPI: Angela Guzman is a 25 y.o. female here today to establish care and for a skin lesion on her L hip/thigh she has had maybe since birth that has recently seemed to get a little larger and pruritic.  Over the last year she has developed another lesion that is similar in the L flank region.  She has a 46 month old daughter and a h/o SVT that is controlled with prn metoprolol.  She can usu reduce rate with Valsalva techniques.  She denies palpitations or CP.     ED/Hospital notes reviewed.     ROS:   Constitutional:  No f/c, No night sweats, No unexplained weight loss. EENT:  No vision changes, No blurry vision, No hearing changes. No mouth, throat, or ear problems.  Respiratory: No cough, No SOB Cardiac: No CP, no palpitations GI:  No abd pain, No N/V/D. GU: No Urinary s/sx Musculoskeletal: No joint pain Neuro: No headache, no dizziness, no motor weakness.  Skin: No rash Endocrine:  No polydipsia. No polyuria.  Psych: Denies SI/HI  No problems updated.  ALLERGIES: No Known Allergies  PAST MEDICAL HISTORY: Past Medical History  Diagnosis Date  . UTI (lower urinary tract infection)   . SVT (supraventricular tachycardia) ([place])   . Allergic rhinitis   . Tobacco abuse   . Normal pregnancy in multigravida in third trimester 10/15/2014  . SVD (spontaneous vaginal delivery) 10/16/2014    MEDICATIONS AT HOME: Prior to Admission medications   Medication Sig Start Date End Date Taking? Authorizing Provider  ibuprofen (ADVIL,MOTRIN) 100 MG/5ML suspension Take 30 mLs (600 mg total) by mouth every 6 (six) hours. Patient taking differently: Take 600 mg by mouth every 6 (six) hours as needed  (migraine).  10/17/14  Yes Jody Bovard-Stuckert, MD  flintstones complete (FLINTSTONES) 60 MG chewable tablet Chew 1 tablet by mouth 2 (two) times daily. Patient not taking: Reported on 06/26/2015 10/17/14   Janyth Contes, MD  oxyCODONE-acetaminophen (PERCOCET/ROXICET) 5-325 MG per tablet Take 1-2 tablets by mouth every 6 (six) hours as needed for severe pain. Patient not taking: Reported on 06/26/2015 10/17/14   Janyth Contes, MD     Objective:  Jasmine DecemberDanley Danker Vitals:   11/12/15 1454  BP: 130/80  Pulse: 89  Temp: 98 F (36.7 C)  TempSrc: Oral  Weight: 166 lb 9.6 oz (75.569 kg)    General appearance : A&OX3. NAD. Non-toxic-appearing HEENT: Atraumatic and Normocephalic.  PERRLA. EOM intact.  TM clear B. Mouth-MMM, post pharynx WNL w/o erythema, No PND. Neck: supple, no JVD. No cervical lymphadenopathy. No thyromegaly Chest/Lungs:  Breathing-non-labored, Good air entry bilaterally, breath sounds normal without rales, rhonchi, or wheezing  CVS: S1 S2 regular, no murmurs, gallops, rubs  Extremities: Bilateral Lower Ext shows no edema, both legs are warm to touch with = pulse throughout Neurology:  CN II-XII grossly intact, Non focal.   Psych:  TP linear. J/I WNL. Normal speech. Appropriate eye contact and affect.  Skin:  1) L upper lateral thigh <1cm slightly raised and firm and slightly hyperpigmented lesion with regular borders +dimple sign.  No abnormal discoloration 2)  L flank with slightly smaller but identical lesion No signs of secondary infection. Many nevi across entire body  Data Review Lab Results  Component Value Date   HGBA1C 5.0 06/04/2013     Assessment & Plan   1. Dermatofibroma Longstanding and 1 newer lesion.  Low concern/risk, but for patient reassurance and because she does have many moles/freckles with fair skin- - Ambulatory referral to Dermatology and reassurance  2. SVT (supraventricular tachycardia) ([facility]) Stable-continue metoprolol and  cardiology f/up prn  Patient have been counseled extensively about nutrition and exercise  Return in about 6 months (around 05/13/2016) for CPE.  The patient was given clear instructions to go to ER or return to medical center if symptoms don't improve, worsen or new problems develop. The patient verbalized understanding. The patient was told to call to get lab results if they haven't heard anything in the next week.     Freeman Caldron, PA-C [facility] and [place], [place]   11/12/2015, 3:14 PM

## 2015-12-26 ENCOUNTER — Encounter (HOSPITAL_COMMUNITY): Payer: Self-pay

## 2015-12-26 ENCOUNTER — Emergency Department (HOSPITAL_COMMUNITY)
Admission: EM | Admit: 2015-12-26 | Discharge: 2015-12-26 | Disposition: A | Discharge status: Home / Self Care | Payer: Medicaid Other | Attending: Emergency Medicine | Admitting: Emergency Medicine

## 2015-12-26 DIAGNOSIS — Z87891 Personal history of nicotine dependence: Secondary | ICD-10-CM | POA: Insufficient documentation

## 2015-12-26 DIAGNOSIS — I471 Supraventricular tachycardia: Secondary | ICD-10-CM | POA: Insufficient documentation

## 2015-12-26 DIAGNOSIS — R42 Dizziness and giddiness: Secondary | ICD-10-CM | POA: Diagnosis present

## 2015-12-26 LAB — BASIC METABOLIC PANEL
Anion gap: 10 (ref 5–15)
BUN: 12 mg/dL (ref 6–20)
CHLORIDE: 105 mmol/L (ref 101–111)
CO2: 23 mmol/L (ref 22–32)
Calcium: 9.4 mg/dL (ref 8.9–10.3)
Creatinine, Ser: 0.92 mg/dL (ref 0.44–1.00)
GFR calc non Af Amer: >60 mL/min (ref >60)
GLUCOSE: 89 mg/dL (ref 65–99)
Potassium: 3.8 mmol/L (ref 3.5–5.1)
Sodium: 138 mmol/L (ref 135–145)

## 2015-12-26 LAB — CBC WITH DIFFERENTIAL/PLATELET
BASOS PCT: 0 %
Basophils Absolute: 0 10*3/uL (ref 0.0–0.1)
Eosinophils Absolute: 0 10*3/uL (ref 0.0–0.7)
Eosinophils Relative: 0 %
HEMATOCRIT: 39.7 % (ref 36.0–46.0)
HEMOGLOBIN: 14.5 g/dL (ref 12.0–15.0)
LYMPHS PCT: 18 %
Lymphs Abs: 1.8 10*3/uL (ref 0.7–4.0)
MCH: 31.8 pg (ref 26.0–34.0)
MCHC: 36.5 g/dL — ABNORMAL HIGH (ref 30.0–36.0)
MCV: 87.1 fL (ref 78.0–100.0)
MONO ABS: 0.6 10*3/uL (ref 0.1–1.0)
MONOS PCT: 6 %
NEUTROS ABS: 7.4 10*3/uL (ref 1.7–7.7)
NEUTROS PCT: 76 %
Platelets: 194 10*3/uL (ref 150–400)
RBC: 4.56 MIL/uL (ref 3.87–5.11)
RDW: 12.6 % (ref 11.5–15.5)
WBC: 10 10*3/uL (ref 4.0–10.5)

## 2015-12-26 LAB — I-STAT TROPONIN, ED: TROPONIN I, POC: 0.03 ng/mL (ref 0.00–0.08)

## 2015-12-26 MED ORDER — SODIUM CHLORIDE 0.9 % IV BOLUS (SEPSIS)
1000.0000 mL | Freq: Once | INTRAVENOUS | Status: AC
  Administered 2015-12-26: 1000 mL via INTRAVENOUS

## 2015-12-26 MED ORDER — METOPROLOL TARTRATE 25 MG PO TABS
12.5000 mg | ORAL_TABLET | Freq: Once | ORAL | Status: AC
  Administered 2015-12-26: 12.5 mg via ORAL
  Filled 2015-12-26: qty 1

## 2015-12-26 MED ORDER — LORAZEPAM 2 MG/ML IJ SOLN
1.0000 mg | Freq: Once | INTRAMUSCULAR | Status: AC
  Administered 2015-12-26: 1 mg via INTRAVENOUS
  Filled 2015-12-26: qty 1

## 2015-12-26 NOTE — ED Notes (Signed)
According to EMS, pt had episode of SVT, pt unable to take her Rx metoprolol. EMS arrived on the scene to pt HR 250 SVT, EMS administered 1st 6mg  of Adenosine, then 12mg  of Adenosine. Pt denies cp, dizziness, and nausea upon arrival to [facility] ED. Pt A+OX4, speaking in complete sentences.

## 2015-12-26 NOTE — Discharge Instructions (Signed)
Please take your metoprolol as directed.  Please follow-up with the cardiology group listed above.  Paroxysmal Supraventricular Tachycardia Paroxysmal supraventricular tachycardia (PSVT) is a type of abnormal heart rhythm. It causes your heart to beat very quickly and then suddenly stop beating so quickly. A normal heart rate is 60-100 beats per minute. During an episode of PSVT, your heart rate may be 150-250 beats per minute. This can make you feel light-headed and short of breath. An episode of PSVT can be frightening. It is usually not dangerous. The heart has four chambers. All chambers need to work together for the heart to beat effectively. A normal heartbeat usually starts in the right upper chamber of the heart (atrium) when an area (sinoatrial node) puts out an electrical signal that spreads to the other chambers. People with PSVT may have abnormal electrical pathways, or they may have other areas in the upper chambers that send out electrical signals. The result is a very rapid heartbeat. When your heart beats very quickly, it does not have time to fill completely with blood. When PSVT happens often or it lasts for long periods, it can lead to heart weakness and failure. Most people with PSVT do not have any other heart disease. CAUSES Abnormal electrical activity in the heart causes PSVT. It is not known why some people get PSVT and others do not. RISK FACTORS You may be more likely to have PSVT if:  You are 79-75 years old.  You are a woman. Other factors that may increase your chances of an attack include:  Stress.  Being tired.  Smoking.  Stimulant drugs.  Alcoholic drinks.  Caffeine.  Pregnancy. SIGNS AND SYMPTOMS A mild episode of PSVT may cause no symptoms. If you do have signs and symptoms, they may include:  A pounding heart.  Feeling of skipped heartbeats (palpitations).  Weakness.  Shortness of breath.  Tightness or pain in your  chest.  Light-headedness.  Anxiety.  Dizziness.  Sweating.  Nausea.  A fainting spell. DIAGNOSIS Your health care provider may suspect PSVT if you have symptoms that come and go. The health care provider will do a physical exam. If you are having an episode during the exam, the health care provider may be able to diagnose PSVT by listening to your heart and feeling your pulse. Tests may also be done, including:  An electrical study of your heart (electrocardiogram, or ECG).  A test in which you wear a portable ECG monitor all day (Holter monitor) or for several days (event monitor).  A test that involves taking an image of your heart using sound waves (echocardiogram) to rule out other causes of a fast heart rate. TREATMENT You may not need treatment if episodes of PSVT do not happen often or if they do not cause symptoms. If PSVT episodes do cause symptoms, your health care provider may first suggest trying a self-treatment called vagus nerve stimulation. The vagus nerve extends down from the brain. It regulates certain body functions. Stimulating this nerve can slow down the heart. Your health care provider can teach you ways to do this. You may need to try a few ways to find what works best for you. Options include:  Holding your breath and pushing, as though you are having a bowel movement.  Massaging an area on one side of your neck below your jaw.  Bending forward with your head between your legs.  Bending forward with your head between your legs and coughing.  Massaging your eyeballs  with your eyes closed. If vagus nerve stimulation does not work, other treatment options include:  Medicines to prevent an attack.  Being treated in the hospital with medicine or electric shock to stop an attack (cardioversion). This treatment can include:  Getting medicine through an IV line.  Having a small electric shock delivered to your heart. You will be given medicine to make you  sleep through this procedure.  If you have frequent episodes with symptoms, you may need a procedure to get rid of the faulty areas of your heart (radiofrequency ablation) and end the episodes of PSVT. In this procedure:  A long, thin tube (catheter) is passed through one of your veins into your heart.  Energy directed through the catheter eliminates the areas of your heart that are causing abnormal electric stimulation. HOME CARE INSTRUCTIONS  Take medicines only as directed by your health care provider.  Do not use caffeine in any form if caffeine triggers episodes of PSVT. Otherwise, consume caffeine in moderation. This means no more than a few cups of coffee or the equivalent each day.  Do not drink alcohol if alcohol triggers episodes of PSVT. Otherwise, limit alcohol intake to no more than 1 drink per day for nonpregnant women and 2 drinks per day for men. One drink equals 12 ounces of beer, 5 ounces of wine, or 1 ounces of hard liquor.  Do not use any tobacco products, including cigarettes, chewing tobacco, or electronic cigarettes. If you need help quitting, ask your health care provider.  Try to get at least 7 hours of sleep each night.  Find healthy ways to manage stress.  Perform vagus nerve stimulation as directed by your health care provider.  Maintain a healthy weight.  Get some exercise on most days. Ask your health care provider to suggest some good activities for you. SEEK MEDICAL CARE IF:  You are having episodes of PSVT more often, or they are lasting longer.  Vagus nerve stimulation is no longer helping.  You have new symptoms during an episode. SEEK IMMEDIATE MEDICAL CARE IF:  You have chest pain or trouble breathing.  You have an episode of PSVT that has lasted longer than 20 minutes.  You have passed out from an episode of PSVT. These symptoms may represent a serious problem that is an emergency. Do not wait to see if the symptoms will go away. Get  medical help right away. Call your local emergency services (911 in the [place] Do not drive yourself to the hospital.   This information is not intended to replace advice given to you by your health care provider. Make sure you discuss any questions you have with your health care provider.   Document Released: 07/04/2005 Document Revised: 07/25/2014 Document Reviewed: 12/12/2013 Elsevier Interactive Patient Education [employer].

## 2015-12-26 NOTE — ED Provider Notes (Signed)
CSN: [id number]     Arrival date & time 12/26/15  1717 History   First MD Initiated Contact with Patient 12/26/15 1722     No chief complaint on file.    (Consider location/radiation/quality/duration/timing/severity/associated sxs/prior Treatment) HPI Comments: Patient presents to the emergency department with chief complaint of SVT. She is brought to ED by EMS. EMS was called to the scene this afternoon after the patient began having some chest pain, palpitations, dizziness, and lightheadedness. When EMS arrived, they report that her heart rate was in the 250s, and had difficulty getting an accurate blood pressure. They initially tried vagal maneuvers, which failed, they subsequently gave 6 mg of adenosine which did not improve the symptoms. They followed this with 12 mg of adenosine, after which the patient converted back to normal rate and rhythm. Patient states that she has a long history of SVT. She takes metoprolol as needed. She has not seen her cardiologist recently. She has been advised that she would likely need an ablation, but she does not want to have this done. Currently, she denies any symptoms. There are no modifying factors.  Denies using any stimulants, energy drinks, or decongestants.  The history is provided by the patient. No language interpreter was used.    Past Medical History  Diagnosis Date  . UTI (lower urinary tract infection)   . SVT (supraventricular tachycardia) ([place])   . Allergic rhinitis   . Tobacco abuse   . Normal pregnancy in multigravida in third trimester 10/15/2014  . SVD (spontaneous vaginal delivery) 10/16/2014   Past Surgical History  Procedure Laterality Date  . No past surgeries     Family History  Problem Relation Age of Onset  . Hypertension Paternal Grandmother   . Diabetes Paternal Grandmother   . Heart Problems Paternal Aunt   . Heart Problems Maternal Grandfather    Social History  Substance Use Topics  . Smoking status: Former Smoker  -- 0.50 packs/day    Types: Cigarettes    Quit date: 01/15/2014  . Smokeless tobacco: Never Used  . Alcohol Use: No     Comment: occasionally   OB History    Gravida Para Term Preterm AB TAB SAB Ectopic Multiple Living   2 2 2  0 0 0 0 0 0 2     Review of Systems  Respiratory: Positive for shortness of breath.   Cardiovascular: Positive for chest pain and palpitations.  Neurological: Positive for dizziness and light-headedness.  All other systems reviewed and are negative.     Allergies  Review of patient's allergies indicates no known allergies.  Home Medications   Prior to Admission medications   Medication Sig Start Date End Date Taking? Authorizing Provider  flintstones complete (FLINTSTONES) 60 MG chewable tablet Chew 1 tablet by mouth 2 (two) times daily. Patient not taking: Reported on 06/26/2015 10/17/14   Janyth Contes, MD  oxyCODONE-acetaminophen (PERCOCET/ROXICET) 5-325 MG per tablet Take 1-2 tablets by mouth every 6 (six) hours as needed for severe pain. Patient not taking: Reported on 06/26/2015 10/17/14   Janyth Contes, MD   There were no vitals taken for this visit. Physical Exam  Constitutional: She is oriented to person, place, and time. She appears well-developed and well-nourished.  HENT:  Head: Normocephalic and atraumatic.  Eyes: Conjunctivae and EOM are normal. Pupils are equal, round, and reactive to light.  Neck: Normal range of motion. Neck supple.  Cardiovascular: Regular rhythm.  Exam reveals no gallop and no friction rub.   No murmur  heard. tachycardic  Pulmonary/Chest: Effort normal and breath sounds normal. No respiratory distress. She has no wheezes. She has no rales. She exhibits no tenderness.  Abdominal: Soft. Bowel sounds are normal. She exhibits no distension and no mass. There is no tenderness. There is no rebound and no guarding.  Musculoskeletal: Normal range of motion. She exhibits no edema or tenderness.  Neurological: She  is alert and oriented to person, place, and time.  Skin: Skin is warm and dry.  Psychiatric: She has a normal mood and affect. Her behavior is normal. Judgment and thought content normal.  anxious  Nursing note and vitals reviewed.   ED Course  Procedures (including critical care time) Results for orders placed or performed during the hospital encounter of 12/26/15  CBC with Differential/Platelet  Result Value Ref Range   WBC 10.0 4.0 - 10.5 K/uL   RBC 4.56 3.87 - 5.11 MIL/uL   Hemoglobin 14.5 12.0 - 15.0 g/dL   HCT 39.7 36.0 - 46.0 %   MCV 87.1 78.0 - 100.0 fL   MCH 31.8 26.0 - 34.0 pg   MCHC 36.5 (H) 30.0 - 36.0 g/dL   RDW 12.6 11.5 - 15.5 %   Platelets 194 150 - 400 K/uL   Neutrophils Relative % 76 %   Neutro Abs 7.4 1.7 - 7.7 K/uL   Lymphocytes Relative 18 %   Lymphs Abs 1.8 0.7 - 4.0 K/uL   Monocytes Relative 6 %   Monocytes Absolute 0.6 0.1 - 1.0 K/uL   Eosinophils Relative 0 %   Eosinophils Absolute 0.0 0.0 - 0.7 K/uL   Basophils Relative 0 %   Basophils Absolute 0.0 0.0 - 0.1 K/uL  Basic metabolic panel  Result Value Ref Range   Sodium 138 135 - 145 mmol/L   Potassium 3.8 3.5 - 5.1 mmol/L   Chloride 105 101 - 111 mmol/L   CO2 23 22 - 32 mmol/L   Glucose, Bld 89 65 - 99 mg/dL   BUN 12 6 - 20 mg/dL   Creatinine, Ser 0.92 0.44 - 1.00 mg/dL   Calcium 9.4 8.9 - 10.3 mg/dL   GFR calc non Af Amer >60 >60 mL/min   GFR calc Af Amer >60 >60 mL/min   Anion gap 10 5 - 15  I-Stat Troponin, ED (not at [facility])  Result Value Ref Range   Troponin i, poc 0.03 0.00 - 0.08 ng/mL   Comment 3           No results found.  I have personally reviewed and evaluated these images and lab results as part of my medical decision-making.   EKG Interpretation   Date/Time:  Saturday December 26 2015 17:43:58 EDT Ventricular Rate:  116 PR Interval:  140 QRS Duration: 83 QT Interval:  334 QTC Calculation: 464 R Axis:   92 Text Interpretation:  Sinus tachycardia Consider right atrial  enlargement  Borderline right axis deviation Confirmed by HAVILAND MD, JULIE ([id number]) on  12/26/2015 6:05:35 PM      MDM   Final diagnoses:  SVT (supraventricular tachycardia) ([place])    Patient with hx of SVT, had another episode of SVT today.  Converted to sinus tach.  Rate in the 110-120s.  Discussed with Dr. Gilford Raid, will check labs and monitor.  She is quite anxious about being here.  She may benefit from a dose of ativan.  Tachycardia improving with fluids and metoprolol.  Patient normally takes 12.5 mg metoprolol, but didn't have it with her today.  HR is  down to below 110.  Discussed with Dr. Gilford Raid, who agrees that patient can be discharged.  Patient feels well.  No complaints.  Advised close follow-up with cardiology and advised her to take her metoprolol.  Montine Circle, PA-C 12/26/15 2121  Isla Pence, MD 12/27/15 [phone]

## 2016-01-15 ENCOUNTER — Encounter: Payer: Self-pay | Admitting: *Deleted

## 2016-01-16 ENCOUNTER — Encounter: Payer: Self-pay | Admitting: Physician Assistant

## 2016-01-16 NOTE — Progress Notes (Deleted)
No show

## 2016-01-18 ENCOUNTER — Encounter: Payer: Medicaid Other | Admitting: Physician Assistant

## 2016-01-18 NOTE — Progress Notes (Signed)
  This encounter was created in error - please disregard. No show 

## 2016-01-19 NOTE — Progress Notes (Signed)
Cardiology Office Note   Date:  01/20/2016   ID:  Angela Guzman, DOB 25-Dec-1990, MRN [id number]  PCP:  No PCP Per Patient  Cardiologist:  Dr. Caryl Comes    Chief Complaint  Patient presents with  . Tachycardia    SVT  . Chest Pain      History of Present Illness: Angela Guzman is a 25 y.o. female who presents for post hospitalization in ER 12/26/15. She was brought in by EMS.  Pt had chest pain, palpitaions, dizziness.  HR was 250s, ? BP, 6 mg adenosine without improvement.  Then 12 mg.  She converted to SR.  She takes her metoprol as needed.ablation has been recommended in the past. Though she is out currently.  She does not wish to pursue ablation.   Last seen 2015.   She has a history of paroxysmal SVT who presents for followup. 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 Valsalva has been unsuccessful for her episodes and she's required ED care with adenosine rx before. She has been treated with beta blocker therapy, reducing the frequency of her episodes. 2D Echo 07/2012: EF 55-65%, no RWMA, mitral valve systolic bowing without prolapse.   Now no further SVT but 2 weeks after had sharp pain with deep breath and that has resolved but she feels SOB all the time.  May be a lttle worse when flat. Does not awaken her from sleep.   But when she gets up with her 25 year old she is aware of the SOB.  Her sats on RA are 99% at rest.       Past Medical History  Diagnosis Date  . UTI (lower urinary tract infection)   . SVT (supraventricular tachycardia) ([place])   . Allergic rhinitis   . Tobacco abuse     Past Surgical History  Procedure Laterality Date  . No past surgeries       Current Outpatient Prescriptions  Medication Sig Dispense Refill  . metoprolol succinate (TOPROL-XL) 25 MG 24 hr tablet Take 25 mg by mouth daily as needed (palpatations). Take (1/2) Tablet by mouth twice daily  if you feel palpatations     No current facility-administered  medications for this visit.    Allergies:   Review of patient's allergies indicates no known allergies.    Social History:  The patient  reports that she quit smoking about 2 years ago. Her smoking use included Cigarettes. She smoked 0.50 packs per day. She has never used smokeless tobacco. She reports that she does not drink alcohol or use illicit drugs.  Family History:  The patient's family history includes Diabetes in her paternal grandmother; Heart Problems in her maternal grandfather and paternal aunt; Hypertension in her paternal grandmother.    ROS:  General:no colds or fevers, no weight changes Skin:no rashes or ulcers HEENT:no blurred vision, no congestion CV:see HPI PUL:see HPI GI:no diarrhea constipation or melena, no indigestion GU:no hematuria, no dysuria MS:no joint pain, no claudication Neuro:no syncope, no lightheadedness Endo:no diabetes, no thyroid disease  Wt Readings from Last 3 Encounters:  01/20/16 165 lb 3.2 oz (74.934 kg)  11/12/15 166 lb 9.6 oz (75.569 kg)  06/26/15 150 lb (68.04 kg)     PHYSICAL EXAM: VS:  BP 118/86 mmHg  Pulse 90  Ht 5\' 7"  (1.702 m)  Wt 165 lb 3.2 oz (74.934 kg)  BMI 25.87 kg/m2  SpO2 99%  LMP 01/06/2016 (Exact Date)  Breastfeeding?  No , BMI Body mass index is 25.87 kg/(m^2). General:Pleasant affect, NAD Skin:Warm and dry, brisk capillary refill HEENT:normocephalic, sclera clear, mucus membranes moist Neck:supple, no JVD, no bruits  Heart:S1S2 RRR without murmur, gallup, rub or click Lungs:clear without rales, rhonchi, or wheezes [id number], non tender, + BS, do not palpate liver spleen or masses Ext:no lower ext edema, 2+ pedal pulses, 2+ radial pulses Neuro:alert and oriented, MAE, follows commands, + facial symmetry  With ambulation sp02 to 95%.  EKG:  EKG is ordered today. The ekg ordered today demonstrates SR at 90 no acute changes.  Rightward axis.     Recent Labs: 12/26/2015: BUN 12; Creatinine, Ser 0.92; Hemoglobin  14.5; Platelets 194; Potassium 3.8; Sodium 138    Lipid Panel No results found for: CHOL, TRIG, HDL, CHOLHDL, VLDL, LDLCALC, LDLDIRECT     Other studies Reviewed: Additional studies/ records that were reviewed today include: previous notes, . Echo:  07/30/12 Study Conclusions  - Left ventricle: The cavity size was normal. Wall thickness was normal. Systolic function was normal. The estimated ejection fraction was in the range of 55% to 65%. Wall motion was normal; there were no regional wall motion abnormalities. Left ventricular diastolic function parameters were normal. - Mitral valve: Systolic bowing without prolapse. - Atrial septum: No defect or patent foramen ovale was identified.  ASSESSMENT AND PLAN:  1. SOB constant, will check CXR, BNP and ddimer.  If no improvement will do Echo. Will have her follow up in 2 weeks .  2.  PSVT will reorder metoprolol 25 take half tablet prn.     Current medicines are reviewed with the patient today.  The patient Has no concerns regarding medicines.  The following changes have been made:  See above Labs/ tests ordered today include:see above  Disposition:   FU:  see above  Signed, Cecilie Kicks, NP  01/20/2016 3:02 PM    [facility] [place], [place], [place] Phone: [phone]; Fax: [phone]

## 2016-01-20 ENCOUNTER — Encounter: Payer: Self-pay | Admitting: Cardiology

## 2016-01-20 ENCOUNTER — Ambulatory Visit: Payer: Medicaid Other | Admitting: Physician Assistant

## 2016-01-20 ENCOUNTER — Ambulatory Visit (INDEPENDENT_AMBULATORY_CARE_PROVIDER_SITE_OTHER): Payer: Medicaid Other | Admitting: Cardiology

## 2016-01-20 ENCOUNTER — Ambulatory Visit: Payer: Medicaid Other | Admitting: Cardiology

## 2016-01-20 ENCOUNTER — Ambulatory Visit
Admission: RE | Admit: 2016-01-20 | Discharge: 2016-01-20 | Disposition: A | Discharge status: Home / Self Care | Payer: Medicaid Other | Source: Ambulatory Visit | Attending: Cardiology | Admitting: Cardiology

## 2016-01-20 VITALS — BP 118/86 | HR 90 | Ht 67.0 in | Wt 165.2 lb

## 2016-01-20 DIAGNOSIS — R002 Palpitations: Secondary | ICD-10-CM

## 2016-01-20 DIAGNOSIS — R0602 Shortness of breath: Secondary | ICD-10-CM | POA: Diagnosis not present

## 2016-01-20 LAB — BASIC METABOLIC PANEL
BUN: 11 mg/dL (ref 7–25)
CO2: 27 mmol/L (ref 20–31)
CREATININE: 0.85 mg/dL (ref 0.50–1.10)
Calcium: 9.1 mg/dL (ref 8.6–10.2)
Chloride: 102 mmol/L (ref 98–110)
Glucose, Bld: 81 mg/dL (ref 65–99)
Potassium: 3.9 mmol/L (ref 3.5–5.3)
Sodium: 137 mmol/L (ref 135–146)

## 2016-01-20 LAB — D-DIMER, QUANTITATIVE (NOT AT ARMC): D-Dimer, Quant: <0.27 ug/mL-FEU (ref 0.00–0.50)

## 2016-01-20 MED ORDER — METOPROLOL SUCCINATE ER 25 MG PO TB24: 12.5000 mg | ORAL_TABLET | ORAL | Status: DC | PRN

## 2016-01-20 NOTE — Patient Instructions (Signed)
Medication Instructions:  YOU CAN TAKE METOPROLOL 25 MG TABLET WITH THE DIRECTIONS TO TAKE 1/2 TABLET AS NEEDED FOR PALPITATIONS.  Labwork: TODAY BMET, BNP AND STAT D-DIMER  Testing/Procedures: CHEST X-RAY TODAY TO BE DONE AT [facility] [place]  Follow-Up: 2 WEEKS LAURA INGOLD, PAC ; IF YOU ARE FEELING BETTER OK PER LAURA INGOLD, PAC TODAY YOU CAN CANCEL THE FOLLOW UP APPT.   Any Other Special Instructions Will Be Listed Below (If Applicable).     If you need a refill on your cardiac medications before your next appointment, please call your pharmacy.

## 2016-01-21 ENCOUNTER — Telehealth: Payer: Self-pay | Admitting: Cardiology

## 2016-01-21 ENCOUNTER — Telehealth: Payer: Self-pay

## 2016-01-21 LAB — BRAIN NATRIURETIC PEPTIDE: BRAIN NATRIURETIC PEPTIDE: 5.3 pg/mL (ref <100)

## 2016-01-21 NOTE — Telephone Encounter (Signed)
Follow-up     The pt is returning the phone call regarding the x-ray results.

## 2016-01-21 NOTE — Telephone Encounter (Signed)
Returned pts call, as she was returning mine, but her voicemail was full and couldn't leave a message at this time.

## 2016-01-21 NOTE — Telephone Encounter (Signed)
Attempt to contact patient regarding family and medical history, voicemail was not set up to leave patient a message.

## 2016-01-21 NOTE — Telephone Encounter (Signed)
Follow up ° ° °Pt is returning call for rn  °

## 2016-01-21 NOTE — Telephone Encounter (Signed)
Returned pts call again, vmail still full and could not leave a message.

## 2016-01-22 ENCOUNTER — Telehealth: Payer: Self-pay | Admitting: Cardiology

## 2016-01-22 NOTE — Telephone Encounter (Signed)
Pt aware of her lab results and cxr and she verbalized understanding.

## 2016-01-22 NOTE — Telephone Encounter (Signed)
F/u Message  Pt returning RN call. Please call back to dicuss

## 2016-02-10 ENCOUNTER — Ambulatory Visit: Payer: Medicaid Other | Admitting: Cardiology

## 2016-02-23 NOTE — Progress Notes (Deleted)
Cardiology Office Note   Date:  02/23/2016   ID:  Angela Guzman, DOB 06-14-1991, MRN [id number]  PCP:  No PCP Per Patient  Cardiologist:  Dr. Caryl Comes       No chief complaint on file.     History of Present Illness: Angela Guzman is a 25 y.o. female who presents for follow up visit.    Last seen 2015.   She has a history of paroxysmal SVT who presents for followup. 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 Valsalva has been unsuccessful for her episodes and she's required ED care with adenosine rx before. She has been treated with beta blocker therapy, reducing the frequency of her episodes. 2D Echo 07/2012: EF 55-65%, no RWMA, mitral valve systolic bowing without prolapse.   Now no further SVT but 2 weeks after had sharp pain with deep breath and that has resolved but she feels SOB all the time.  May be a lttle worse when flat. Does not awaken her from sleep.   But when she gets up with her 25 year old she is aware of the SOB.  Her sats on RA are 99% at rest.       Refilled her BB , her DDimer was normal,  Her BNP 5.3   CXR was stable.    Today***  Past Medical History:  Diagnosis Date  . Allergic rhinitis   . SVT (supraventricular tachycardia) ([place])   . Tobacco abuse   . UTI (lower urinary tract infection)     Past Surgical History:  Procedure Laterality Date  . NO PAST SURGERIES       Current Outpatient Prescriptions  Medication Sig Dispense Refill  . metoprolol succinate (TOPROL-XL) 25 MG 24 hr tablet Take 0.5 tablets (12.5 mg total) by mouth as needed (palpatations). Take (1/2) Tablet by mouth twice daily  if you feel palpatations     No current facility-administered medications for this visit.     Allergies:   Review of patient's allergies indicates no known allergies.    Social History:  The patient  reports that she quit smoking about 2 years ago. Her smoking use included Cigarettes. She smoked 0.50 packs per day. She  has never used smokeless tobacco. She reports that she does not drink alcohol or use drugs.   Family History:  The patient's ***family history includes Diabetes in her paternal grandmother; Heart Problems in her maternal grandfather and paternal aunt; Hypertension in her paternal grandmother.    ROS:  General:no colds or fevers, no weight changes Skin:no rashes or ulcers HEENT:no blurred vision, no congestion CV:see HPI PUL:see HPI GI:no diarrhea constipation or melena, no indigestion GU:no hematuria, no dysuria MS:no joint pain, no claudication Neuro:no syncope, no lightheadedness Endo:no diabetes, no thyroid disease Wt Readings from Last 3 Encounters:  01/20/16 165 lb 3.2 oz (74.9 kg)  11/12/15 166 lb 9.6 oz (75.6 kg)  06/26/15 150 lb (68 kg)     PHYSICAL EXAM: VS:  There were no vitals taken for this visit. , BMI There is no height or weight on file to calculate BMI. General:Pleasant affect, NAD Skin:Warm and dry, brisk capillary refill HEENT:normocephalic, sclera clear, mucus membranes moist Neck:supple, no JVD, no bruits  Heart:S1S2 RRR without murmur, gallup, rub or click Lungs:clear without rales, rhonchi, or wheezes [id number], non tender, + BS, do not palpate liver spleen or masses Ext:no lower ext edema, 2+ pedal pulses, 2+ radial pulses Neuro:alert and  oriented, MAE, follows commands, + facial symmetry    EKG:  EKG is ordered today. The ekg ordered today demonstrates ***   Recent Labs: 12/26/2015: Hemoglobin 14.5; Platelets 194 01/20/2016: Brain Natriuretic Peptide 5.3; BUN 11; Creat 0.85; Potassium 3.9; Sodium 137    Lipid Panel No results found for: CHOL, TRIG, HDL, CHOLHDL, VLDL, LDLCALC, LDLDIRECT     Other studies Reviewed: Additional studies/ records that were reviewed today include: ***.   ASSESSMENT AND PLAN:  1.  ***   Current medicines are reviewed with the patient today.  The patient Has no concerns regarding medicines.  The following  changes have been made:  See above Labs/ tests ordered today include:see above  Disposition:   FU:  see above  Signed, Cecilie Kicks, NP  02/23/2016 10:34 PM    [facility] [place], [place], [place], [place] Phone: [phone]; Fax: [phone]

## 2016-02-24 ENCOUNTER — Ambulatory Visit: Payer: Medicaid Other | Admitting: Cardiology

## 2017-08-21 ENCOUNTER — Encounter (HOSPITAL_BASED_OUTPATIENT_CLINIC_OR_DEPARTMENT_OTHER): Payer: Self-pay | Admitting: *Deleted

## 2017-08-21 ENCOUNTER — Other Ambulatory Visit: Payer: Self-pay

## 2017-08-21 ENCOUNTER — Emergency Department (HOSPITAL_BASED_OUTPATIENT_CLINIC_OR_DEPARTMENT_OTHER)
Admission: EM | Admit: 2017-08-21 | Discharge: 2017-08-21 | Discharge status: Against Medical Advice | Payer: Self-pay | Attending: Emergency Medicine | Admitting: Emergency Medicine

## 2017-08-21 DIAGNOSIS — Y929 Unspecified place or not applicable: Secondary | ICD-10-CM | POA: Insufficient documentation

## 2017-08-21 DIAGNOSIS — Y939 Activity, unspecified: Secondary | ICD-10-CM | POA: Insufficient documentation

## 2017-08-21 DIAGNOSIS — X58XXXA Exposure to other specified factors, initial encounter: Secondary | ICD-10-CM | POA: Insufficient documentation

## 2017-08-21 DIAGNOSIS — S61219A Laceration without foreign body of unspecified finger without damage to nail, initial encounter: Secondary | ICD-10-CM | POA: Insufficient documentation

## 2017-08-21 DIAGNOSIS — Y999 Unspecified external cause status: Secondary | ICD-10-CM | POA: Insufficient documentation

## 2017-08-21 NOTE — ED Triage Notes (Signed)
Pt c/o lac to index and middle finger by metal x 3 hrs ago

## 2017-09-02 ENCOUNTER — Other Ambulatory Visit: Payer: Self-pay

## 2017-09-02 ENCOUNTER — Encounter (HOSPITAL_BASED_OUTPATIENT_CLINIC_OR_DEPARTMENT_OTHER): Payer: Self-pay | Admitting: *Deleted

## 2017-09-02 DIAGNOSIS — Z87891 Personal history of nicotine dependence: Secondary | ICD-10-CM | POA: Insufficient documentation

## 2017-09-02 DIAGNOSIS — Z23 Encounter for immunization: Secondary | ICD-10-CM | POA: Insufficient documentation

## 2017-09-02 DIAGNOSIS — S61412A Laceration without foreign body of left hand, initial encounter: Secondary | ICD-10-CM | POA: Insufficient documentation

## 2017-09-02 DIAGNOSIS — Y999 Unspecified external cause status: Secondary | ICD-10-CM | POA: Insufficient documentation

## 2017-09-02 DIAGNOSIS — Y939 Activity, unspecified: Secondary | ICD-10-CM | POA: Insufficient documentation

## 2017-09-02 DIAGNOSIS — Y929 Unspecified place or not applicable: Secondary | ICD-10-CM | POA: Insufficient documentation

## 2017-09-02 DIAGNOSIS — W268XXA Contact with other sharp object(s), not elsewhere classified, initial encounter: Secondary | ICD-10-CM | POA: Insufficient documentation

## 2017-09-02 NOTE — ED Triage Notes (Signed)
Pt reports she self- treated a lac to her left index finger 1 week ago. Here for wound check. Edges of lac well approximated. Tetanus unknown

## 2017-09-03 ENCOUNTER — Emergency Department (HOSPITAL_BASED_OUTPATIENT_CLINIC_OR_DEPARTMENT_OTHER)
Admission: EM | Admit: 2017-09-03 | Discharge: 2017-09-03 | Disposition: A | Discharge status: Home / Self Care | Payer: Self-pay | Attending: Emergency Medicine | Admitting: Emergency Medicine

## 2017-09-03 DIAGNOSIS — S61213A Laceration without foreign body of left middle finger without damage to nail, initial encounter: Secondary | ICD-10-CM

## 2017-09-03 MED ORDER — TETANUS-DIPHTH-ACELL PERTUSSIS 5-2.5-18.5 LF-MCG/0.5 IM SUSP
0.5000 mL | Freq: Once | INTRAMUSCULAR | Status: AC
  Administered 2017-09-03: 0.5 mL via INTRAMUSCULAR
  Filled 2017-09-03: qty 0.5

## 2017-09-03 NOTE — ED Notes (Signed)
Alert, NAD, calm, interactive, resps e/u, speaking in clear complete sentences, no dyspnea noted, skin W&D. Wound remains approximated and closed. No s/sx of infection noted. Denies pain at this time. Denies questions at this time.

## 2017-09-03 NOTE — ED Provider Notes (Signed)
[facility] EMERGENCY DEPARTMENT Provider Note   CSN: [id number] Arrival date & time: 09/02/17  2231     History   Chief Complaint Chief Complaint  Patient presents with  . Wound Check    HPI Angela Guzman is a 27 y.o. female.  The history is provided by the patient.  Laceration   The incident occurred more than 2 days ago. The laceration is located on the left hand. The laceration mechanism was a a metal edge. The pain is mild. The pain has been constant since onset. She reports no foreign bodies present. Her tetanus status is unknown.   Ports she sustained a laceration to the left middle finger about a week ago.  She has been self treating at home.  She used superglue to help approximate the wound.  She went to come in today for a wound check.  SHe denies fever or vomiting. Past Medical History:  Diagnosis Date  . Allergic rhinitis   . SVT (supraventricular tachycardia) ([place])   . Tobacco abuse   . UTI (lower urinary tract infection)     Patient Active Problem List   Diagnosis Date Noted  . Other normal pregnancy, not first 10/16/2014  . SVD (spontaneous vaginal delivery) 10/16/2014  . Normal pregnancy in multigravida in third trimester 10/15/2014  . Tobacco abuse   . SVT (supraventricular tachycardia) ([place]) 07/24/2012    Past Surgical History:  Procedure Laterality Date  . NO PAST SURGERIES      OB History    Gravida Para Term Preterm AB Living   2 2 2  0 0 2   SAB TAB Ectopic Multiple Live Births   0 0 0 0 2       Home Medications    Prior to Admission medications   Not on File    Family History Family History  Problem Relation Age of Onset  . Hypertension Paternal Grandmother   . Diabetes Paternal Grandmother   . Heart Problems Paternal Aunt   . Heart Problems Maternal Grandfather     Social History Social History   Tobacco Use  . Smoking status: Former Smoker    Packs/day: 0.50    Types: Cigarettes    Last attempt to quit:  01/15/2014    Years since quitting: 3.6  . Smokeless tobacco: Never Used  Substance Use Topics  . Alcohol use: Yes    Comment: occasionally  . Drug use: No     Allergies   Patient has no known allergies.   Review of Systems Review of Systems  Constitutional: Negative for fever.  Gastrointestinal: Negative for vomiting.     Physical Exam Updated Vital Signs BP 124/87 (BP Location: Left Arm)   Pulse 89   Temp 98.9 F (37.2 C) (Oral)   Resp 16   Ht 1.702 m (5\' 7" )   Wt 72.6 kg (160 lb)   LMP 08/24/2017   SpO2 100%   BMI 25.06 kg/m   Physical Exam  CONSTITUTIONAL: Well developed/well nourished HEAD: Normocephalic/atraumatic EYES: EOMI ENMT: Mucous membranes moist NECK: supple no meningeal signs LUNGS:  no apparent distress ABDOMEN: soft NEURO: Pt is awake/alert/appropriate, moves all extremitiesx4.  No facial droop.   EXTREMITIES: pulses normal/equal, full ROM, see photo SKIN: warm, color normal PSYCH: no abnormalities of mood noted, alert and oriented to situation      ED Treatments / Results  Labs (all labs ordered are listed, but only abnormal results are displayed) Labs Reviewed - No data to display  EKG  EKG Interpretation None       Radiology No results found.  Procedures Procedures (including critical care time)  Medications Ordered in ED Medications  Tdap (BOOSTRIX) injection 0.5 mL (not administered)     Initial Impression / Assessment and Plan / ED Course  I have reviewed the triage vital signs and the nursing notes.      Wound is well-appearing, well approximated, no signs of infection.  Will update tetanus  Final Clinical Impressions(s) / ED Diagnoses   Final diagnoses:  Laceration of left middle finger without foreign body without damage to nail, initial encounter    ED Discharge Orders    None       Ripley Fraise, MD 09/03/17 [phone]

## 2017-09-22 ENCOUNTER — Emergency Department (HOSPITAL_BASED_OUTPATIENT_CLINIC_OR_DEPARTMENT_OTHER)
Admission: EM | Admit: 2017-09-22 | Discharge: 2017-09-22 | Disposition: A | Discharge status: Home / Self Care | Payer: Self-pay | Attending: Emergency Medicine | Admitting: Emergency Medicine

## 2017-09-22 ENCOUNTER — Other Ambulatory Visit: Payer: Self-pay

## 2017-09-22 ENCOUNTER — Encounter (HOSPITAL_BASED_OUTPATIENT_CLINIC_OR_DEPARTMENT_OTHER): Payer: Self-pay | Admitting: *Deleted

## 2017-09-22 DIAGNOSIS — H6501 Acute serous otitis media, right ear: Secondary | ICD-10-CM

## 2017-09-22 DIAGNOSIS — J3089 Other allergic rhinitis: Secondary | ICD-10-CM

## 2017-09-22 DIAGNOSIS — Z87891 Personal history of nicotine dependence: Secondary | ICD-10-CM | POA: Insufficient documentation

## 2017-09-22 DIAGNOSIS — J309 Allergic rhinitis, unspecified: Secondary | ICD-10-CM | POA: Insufficient documentation

## 2017-09-22 DIAGNOSIS — J0100 Acute maxillary sinusitis, unspecified: Secondary | ICD-10-CM

## 2017-09-22 MED ORDER — CETIRIZINE HCL 10 MG PO TABS: 10.0000 mg | ORAL_TABLET | Freq: Every day | ORAL | 0 refills | Status: DC

## 2017-09-22 MED ORDER — FLUTICASONE PROPIONATE 50 MCG/ACT NA SUSP: 2.0000 | Freq: Every day | NASAL | 0 refills | Status: DC

## 2017-09-22 MED ORDER — AMOXICILLIN-POT CLAVULANATE 875-125 MG PO TABS: 1.0000 | ORAL_TABLET | Freq: Two times a day (BID) | ORAL | 0 refills | Status: DC

## 2017-09-22 NOTE — ED Provider Notes (Signed)
[facility] EMERGENCY DEPARTMENT Provider Note   CSN: [id number] Arrival date & time: 09/22/17  1804     History   Chief Complaint Chief Complaint  Patient presents with  . Otalgia    HPI Angela Guzman is a 27 y.o. female with a history of allergic rhinitis who presents emergency department today for right ear otalgia that began last night. Patient notes that she did have preeceding URI symptoms over the last 2 week including congestion, sinus pressure, post nasal drip and cough.  She notes that her allergies have been worse including itchy, watery eyes, nasal congestion, rhinorrhea and sneezing. She reports also recently just returned home from the beach (swam in the pool only, not the ocean) and reports long car trip where she went through the mountains and felt popping sensation in her ears.  She notes that her cough has resolved.  The pain in her ear she describes as a fullness, pressure with associated mild fullness in her hearing.  She denies any drainage, tinnitus, fever, sinus pressure, sore throat, neck pain.  No reported barotrauma.  HPI  Past Medical History:  Diagnosis Date  . Allergic rhinitis   . SVT (supraventricular tachycardia) ([place])   . Tobacco abuse   . UTI (lower urinary tract infection)     Patient Active Problem List   Diagnosis Date Noted  . Other normal pregnancy, not first 10/16/2014  . SVD (spontaneous vaginal delivery) 10/16/2014  . Normal pregnancy in multigravida in third trimester 10/15/2014  . Tobacco abuse   . SVT (supraventricular tachycardia) ([place]) 07/24/2012    Past Surgical History:  Procedure Laterality Date  . NO PAST SURGERIES      OB History    Gravida Para Term Preterm AB Living   2 2 2  0 0 2   SAB TAB Ectopic Multiple Live Births   0 0 0 0 2       Home Medications    Prior to Admission medications   Not on File    Family History Family History  Problem Relation Age of Onset  . Hypertension Paternal  Grandmother   . Diabetes Paternal Grandmother   . Heart Problems Paternal Aunt   . Heart Problems Maternal Grandfather     Social History Social History   Tobacco Use  . Smoking status: Former Smoker    Packs/day: 0.50    Types: Cigarettes    Last attempt to quit: 01/15/2014    Years since quitting: 3.6  . Smokeless tobacco: Never Used  Substance Use Topics  . Alcohol use: Yes    Comment: occasionally  . Drug use: No     Allergies   Patient has no known allergies.   Review of Systems Review of Systems  All other systems reviewed and are negative.    Physical Exam Updated Vital Signs BP 121/82   Pulse (!) 103   Temp 98.8 F (37.1 C) (Oral)   Resp 20   Ht 5\' 7"  (1.702 m)   Wt 72.6 kg (160 lb)   LMP 08/24/2017   SpO2 100%   BMI 25.06 kg/m   Physical Exam  Constitutional: She appears well-developed and well-nourished. No distress.  HENT:  Head: Normocephalic and atraumatic.  Right Ear: Hearing, external ear and ear canal normal. No foreign bodies. No mastoid tenderness. Tympanic membrane is bulging. Tympanic membrane is not injected, not perforated, not erythematous and not retracted.  Left Ear: Hearing, tympanic membrane, external ear and ear canal normal. No foreign  bodies. No mastoid tenderness. Tympanic membrane is not injected, not perforated, not erythematous, not retracted and not bulging.  Nose: No mucosal edema, rhinorrhea, sinus tenderness, septal deviation or nasal septal hematoma.  No foreign bodies. Right sinus exhibits maxillary sinus tenderness. Right sinus exhibits no frontal sinus tenderness. Left sinus exhibits maxillary sinus tenderness. Left sinus exhibits no frontal sinus tenderness.  Fluid behind the right ear with some bulging. No mastoid ttp or redness.  The patient has normal phonation and is in control of secretions. No stridor.  Midline uvula without edema. Soft palate rises symmetrically.  No tonsillar erythema or exudates. No PTA. Tongue  protrusion is normal. No trismus. No creptius on neck palpation and patient has good dentition. No gingival erythema or fluctuance noted. Mucus membranes moist.  Eyes: Conjunctivae, EOM and lids are normal. Pupils are equal, round, and reactive to light. Right eye exhibits no discharge. Left eye exhibits no discharge. Right conjunctiva is not injected. Left conjunctiva is not injected.  Neck: Trachea normal, normal range of motion, full passive range of motion without pain and phonation normal. Neck supple. No spinous process tenderness and no muscular tenderness present. No neck rigidity. No tracheal deviation and normal range of motion present.  No nuchal rigidity or meningismus  Cardiovascular: Normal rate and regular rhythm.  Pulmonary/Chest: Effort normal and breath sounds normal. No stridor. She has no wheezes.  Abdominal: Soft.  Lymphadenopathy:       Head (right side): No submental, no submandibular, no tonsillar, no preauricular, no posterior auricular and no occipital adenopathy present.       Head (left side): No submental, no submandibular, no tonsillar, no preauricular, no posterior auricular and no occipital adenopathy present.    She has no cervical adenopathy.  Neurological: She is alert.  Skin: Skin is warm and dry. No rash noted.  No vesicular-like rash.  Psychiatric: She has a normal mood and affect.  Nursing note and vitals reviewed.    ED Treatments / Results  Labs (all labs ordered are listed, but only abnormal results are displayed) Labs Reviewed - No data to display  EKG  EKG Interpretation None       Radiology No results found.  Procedures Procedures (including critical care time)  Medications Ordered in ED Medications - No data to display   Initial Impression / Assessment and Plan / ED Course  I have reviewed the triage vital signs and the nursing notes.  Pertinent labs & imaging results that were available during my care of the patient were  reviewed by me and considered in my medical decision making (see chart for details).     Patient complaining of symptoms of sinusitis.  Severe symptoms have been present for greater than 10 days with purulent nasal discharge and maxillary sinus pain.  Concern for acute bacterial rhinosinusitis.  Ear pain 2/2 to fluid behind right ear. Patient discharged with Augmentin. Patient also appears to have allergy symptoms. Will give zyrtec and flonase. Instructions given for warm saline nasal wash and recommendations for follow-up with primary care physician.  Strict return precautions discussed.   Final Clinical Impressions(s) / ED Diagnoses   Final diagnoses:  Right acute serous otitis media, recurrence not specified  Subacute maxillary sinusitis  Allergic rhinitis due to other allergic trigger, unspecified seasonality    ED Discharge Orders        Ordered    amoxicillin-clavulanate (AUGMENTIN) 875-125 MG tablet  Every 12 hours     09/22/17 1901    cetirizine (  ZYRTEC) 10 MG tablet  Daily     09/22/17 1901    fluticasone (FLONASE) 50 MCG/ACT nasal spray  Daily     09/22/17 1901       Lorelle Gibbs 09/22/17 2348    Fatima Blank, MD 09/22/17 2352

## 2017-09-22 NOTE — ED Triage Notes (Signed)
She woke last night with fullness in her right ear.

## 2017-09-22 NOTE — Discharge Instructions (Signed)
Follow attached handouts. Follow up with PCP. Please take all of your antibiotics until finished!   You may develop abdominal discomfort or diarrhea from the antibiotic.  You may help offset this with probiotics which you can buy or get in yogurt. Do not eat or take the probiotics until 2 hours after your antibiotic. Do not take your medicine if develop an itchy rash, swelling in your mouth or lips, or difficulty breathing. If you develop worsening or new concerning symptoms you can return to the emergency department for re-evaluation.

## 2017-12-19 ENCOUNTER — Emergency Department (HOSPITAL_COMMUNITY)
Admission: EM | Admit: 2017-12-19 | Discharge: 2017-12-19 | Disposition: A | Discharge status: Home / Self Care | Payer: Self-pay | Attending: Emergency Medicine | Admitting: Emergency Medicine

## 2017-12-19 ENCOUNTER — Emergency Department (HOSPITAL_COMMUNITY): Payer: Self-pay

## 2017-12-19 ENCOUNTER — Encounter (HOSPITAL_COMMUNITY): Payer: Self-pay | Admitting: Emergency Medicine

## 2017-12-19 DIAGNOSIS — R079 Chest pain, unspecified: Secondary | ICD-10-CM | POA: Insufficient documentation

## 2017-12-19 DIAGNOSIS — Z5321 Procedure and treatment not carried out due to patient leaving prior to being seen by health care provider: Secondary | ICD-10-CM | POA: Insufficient documentation

## 2017-12-19 LAB — CBC
HEMATOCRIT: 41.7 % (ref 36.0–46.0)
Hemoglobin: 14.4 g/dL (ref 12.0–15.0)
MCH: 31 pg (ref 26.0–34.0)
MCHC: 34.5 g/dL (ref 30.0–36.0)
MCV: 89.9 fL (ref 78.0–100.0)
Platelets: 190 10*3/uL (ref 150–400)
RBC: 4.64 MIL/uL (ref 3.87–5.11)
RDW: 12.6 % (ref 11.5–15.5)
WBC: 6.7 10*3/uL (ref 4.0–10.5)

## 2017-12-19 LAB — BASIC METABOLIC PANEL
Anion gap: 9 (ref 5–15)
BUN: 11 mg/dL (ref 6–20)
CHLORIDE: 105 mmol/L (ref 101–111)
CO2: 24 mmol/L (ref 22–32)
Calcium: 9 mg/dL (ref 8.9–10.3)
Creatinine, Ser: 0.86 mg/dL (ref 0.44–1.00)
GFR calc Af Amer: >60 mL/min (ref >60)
GFR calc non Af Amer: >60 mL/min (ref >60)
GLUCOSE: 91 mg/dL (ref 65–99)
POTASSIUM: 3.3 mmol/L — AB (ref 3.5–5.1)
Sodium: 138 mmol/L (ref 135–145)

## 2017-12-19 LAB — I-STAT TROPONIN, ED: Troponin i, poc: 0 ng/mL (ref 0.00–0.08)

## 2017-12-19 LAB — I-STAT BETA HCG BLOOD, ED (MC, WL, AP ONLY): I-stat hCG, quantitative: <5 m[IU]/mL (ref <5)

## 2017-12-19 NOTE — ED Notes (Signed)
Pt called in lobby no response. RN advised

## 2017-12-19 NOTE — ED Notes (Signed)
Called pt in lobby for bed x2, no answer

## 2017-12-19 NOTE — ED Triage Notes (Signed)
Pt states for the past 1 week she has been feeling dizzy and light headed with cp. Pt reports a history of SVT. On EKG in triage pt is in NSR.

## 2017-12-21 NOTE — ED Notes (Signed)
Follow up call made  No answer  12/21/17  1426  s Kyrian Stage rn

## 2018-03-26 ENCOUNTER — Ambulatory Visit: Payer: Self-pay | Admitting: Cardiology

## 2018-03-26 ENCOUNTER — Encounter

## 2018-04-19 ENCOUNTER — Ambulatory Visit (INDEPENDENT_AMBULATORY_CARE_PROVIDER_SITE_OTHER): Payer: Self-pay | Admitting: Family Medicine

## 2018-04-19 ENCOUNTER — Encounter: Payer: Self-pay | Admitting: Family Medicine

## 2018-04-19 VITALS — BP 122/62 | HR 97 | Temp 98.3°F | Ht 66.0 in | Wt 154.8 lb

## 2018-04-19 DIAGNOSIS — R42 Dizziness and giddiness: Secondary | ICD-10-CM

## 2018-04-19 DIAGNOSIS — Z23 Encounter for immunization: Secondary | ICD-10-CM

## 2018-04-19 DIAGNOSIS — F419 Anxiety disorder, unspecified: Secondary | ICD-10-CM

## 2018-04-19 DIAGNOSIS — R002 Palpitations: Secondary | ICD-10-CM

## 2018-04-19 MED ORDER — ESCITALOPRAM OXALATE 10 MG PO TABS: 10.0000 mg | ORAL_TABLET | Freq: Every day | ORAL | 2 refills | Status: DC
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
## 2018-05-02 NOTE — Progress Notes (Deleted)
  Angela Guzman DOB: August 31, 1990 Encounter date: 05/03/2018  This is a 27 y.o. female who presents with No chief complaint on file.   History of present illness: Last ov was 10/3 and we started tx w lexapro for anxiety type symptoms that have been evaluated by cardiology and neurology. Records requested but have not been received.  HPI   No Known Allergies No outpatient medications have been marked as taking for the 05/03/18 encounter (Appointment) with Caren Macadam, MD.    Review of Systems  Objective:  There were no vitals taken for this visit.      BP Readings from Last 3 Encounters:  04/19/18 122/62  12/19/17 129/70  09/22/17 121/82   Wt Readings from Last 3 Encounters:  04/19/18 154 lb 12.8 oz (70.2 kg)  09/22/17 160 lb (72.6 kg)  09/02/17 160 lb (72.6 kg)    Physical Exam  Assessment/Plan  There are no diagnoses linked to this encounter.       Micheline Rough, MD

## 2018-05-03 ENCOUNTER — Ambulatory Visit: Payer: Self-pay | Admitting: Family Medicine

## 2018-05-03 ENCOUNTER — Emergency Department (HOSPITAL_BASED_OUTPATIENT_CLINIC_OR_DEPARTMENT_OTHER)
Admission: EM | Admit: 2018-05-03 | Discharge: 2018-05-03 | Disposition: A | Discharge status: Home / Self Care | Payer: No Typology Code available for payment source | Attending: Emergency Medicine | Admitting: Emergency Medicine

## 2018-05-03 ENCOUNTER — Encounter (HOSPITAL_BASED_OUTPATIENT_CLINIC_OR_DEPARTMENT_OTHER): Payer: Self-pay | Admitting: *Deleted

## 2018-05-03 ENCOUNTER — Other Ambulatory Visit: Payer: Self-pay

## 2018-05-03 DIAGNOSIS — Z79899 Other long term (current) drug therapy: Secondary | ICD-10-CM | POA: Insufficient documentation

## 2018-05-03 DIAGNOSIS — M542 Cervicalgia: Secondary | ICD-10-CM | POA: Insufficient documentation

## 2018-05-03 DIAGNOSIS — R51 Headache: Secondary | ICD-10-CM | POA: Diagnosis not present

## 2018-05-03 DIAGNOSIS — Z87891 Personal history of nicotine dependence: Secondary | ICD-10-CM | POA: Diagnosis not present

## 2018-05-03 LAB — PREGNANCY, URINE: Preg Test, Ur: NEGATIVE

## 2018-05-03 MED ORDER — METHOCARBAMOL 500 MG PO TABS: 500.0000 mg | ORAL_TABLET | Freq: Two times a day (BID) | ORAL | 0 refills | Status: DC

## 2018-05-03 MED ORDER — METOCLOPRAMIDE HCL 10 MG PO TABS
10.0000 mg | ORAL_TABLET | Freq: Once | ORAL | Status: AC
  Administered 2018-05-03: 10 mg via ORAL
  Filled 2018-05-03: qty 1

## 2018-05-03 MED ORDER — KETOROLAC TROMETHAMINE 60 MG/2ML IM SOLN
30.0000 mg | Freq: Once | INTRAMUSCULAR | Status: AC
  Administered 2018-05-03: 30 mg via INTRAMUSCULAR
  Filled 2018-05-03: qty 2

## 2018-05-03 MED FILL — METHOCARBAMOL 500 MG TABLET: 500 | 10 days supply | Qty: 20 | Fill #0
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
## 2018-05-07 ENCOUNTER — Telehealth: Payer: Self-pay
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
## 2018-05-16 ENCOUNTER — Ambulatory Visit: Payer: Self-pay | Admitting: Family Medicine

## 2018-05-16 DIAGNOSIS — Z0289 Encounter for other administrative examinations: Secondary | ICD-10-CM

## 2018-05-16 NOTE — Progress Notes (Deleted)
  Mialee Weyman DOB: Jul 30, 1990 Encounter date: 05/16/2018  This is a 27 y.o. female who presents with No chief complaint on file.   History of present illness:  HPI   No Known Allergies No outpatient medications have been marked as taking for the 05/16/18 encounter (Appointment) with Caren Macadam, MD.    Review of Systems  Objective:  There were no vitals taken for this visit.      BP Readings from Last 3 Encounters:  05/03/18 120/78  04/19/18 122/62  12/19/17 129/70   Wt Readings from Last 3 Encounters:  05/03/18 158 lb 11.7 oz (72 kg)  04/19/18 154 lb 12.8 oz (70.2 kg)  09/22/17 160 lb (72.6 kg)    Physical Exam  Assessment/Plan  There are no diagnoses linked to this encounter.       Micheline Rough, MD

## 2018-10-16 ENCOUNTER — Telehealth: Payer: Self-pay

## 2018-10-16 NOTE — Telephone Encounter (Signed)
Patients last OV was 04/19/18, she is due for a follow up.  ATC patient and set up webex appointment. Voicemail is full.  CRM created.

## 2019-04-27 IMAGING — DX DG CHEST 2V
2 series · 2 of 2 positions shown · non-contrast
Comparison: Chest x-ray dated 01/20/2016.

CLINICAL DATA: Dizziness, lightheaded, midsternal chest pain for
the past week. Shortness of breath today. History of SVT. Ex-smoker.

EXAM:
CHEST - 2 VIEW

[w chest pa]
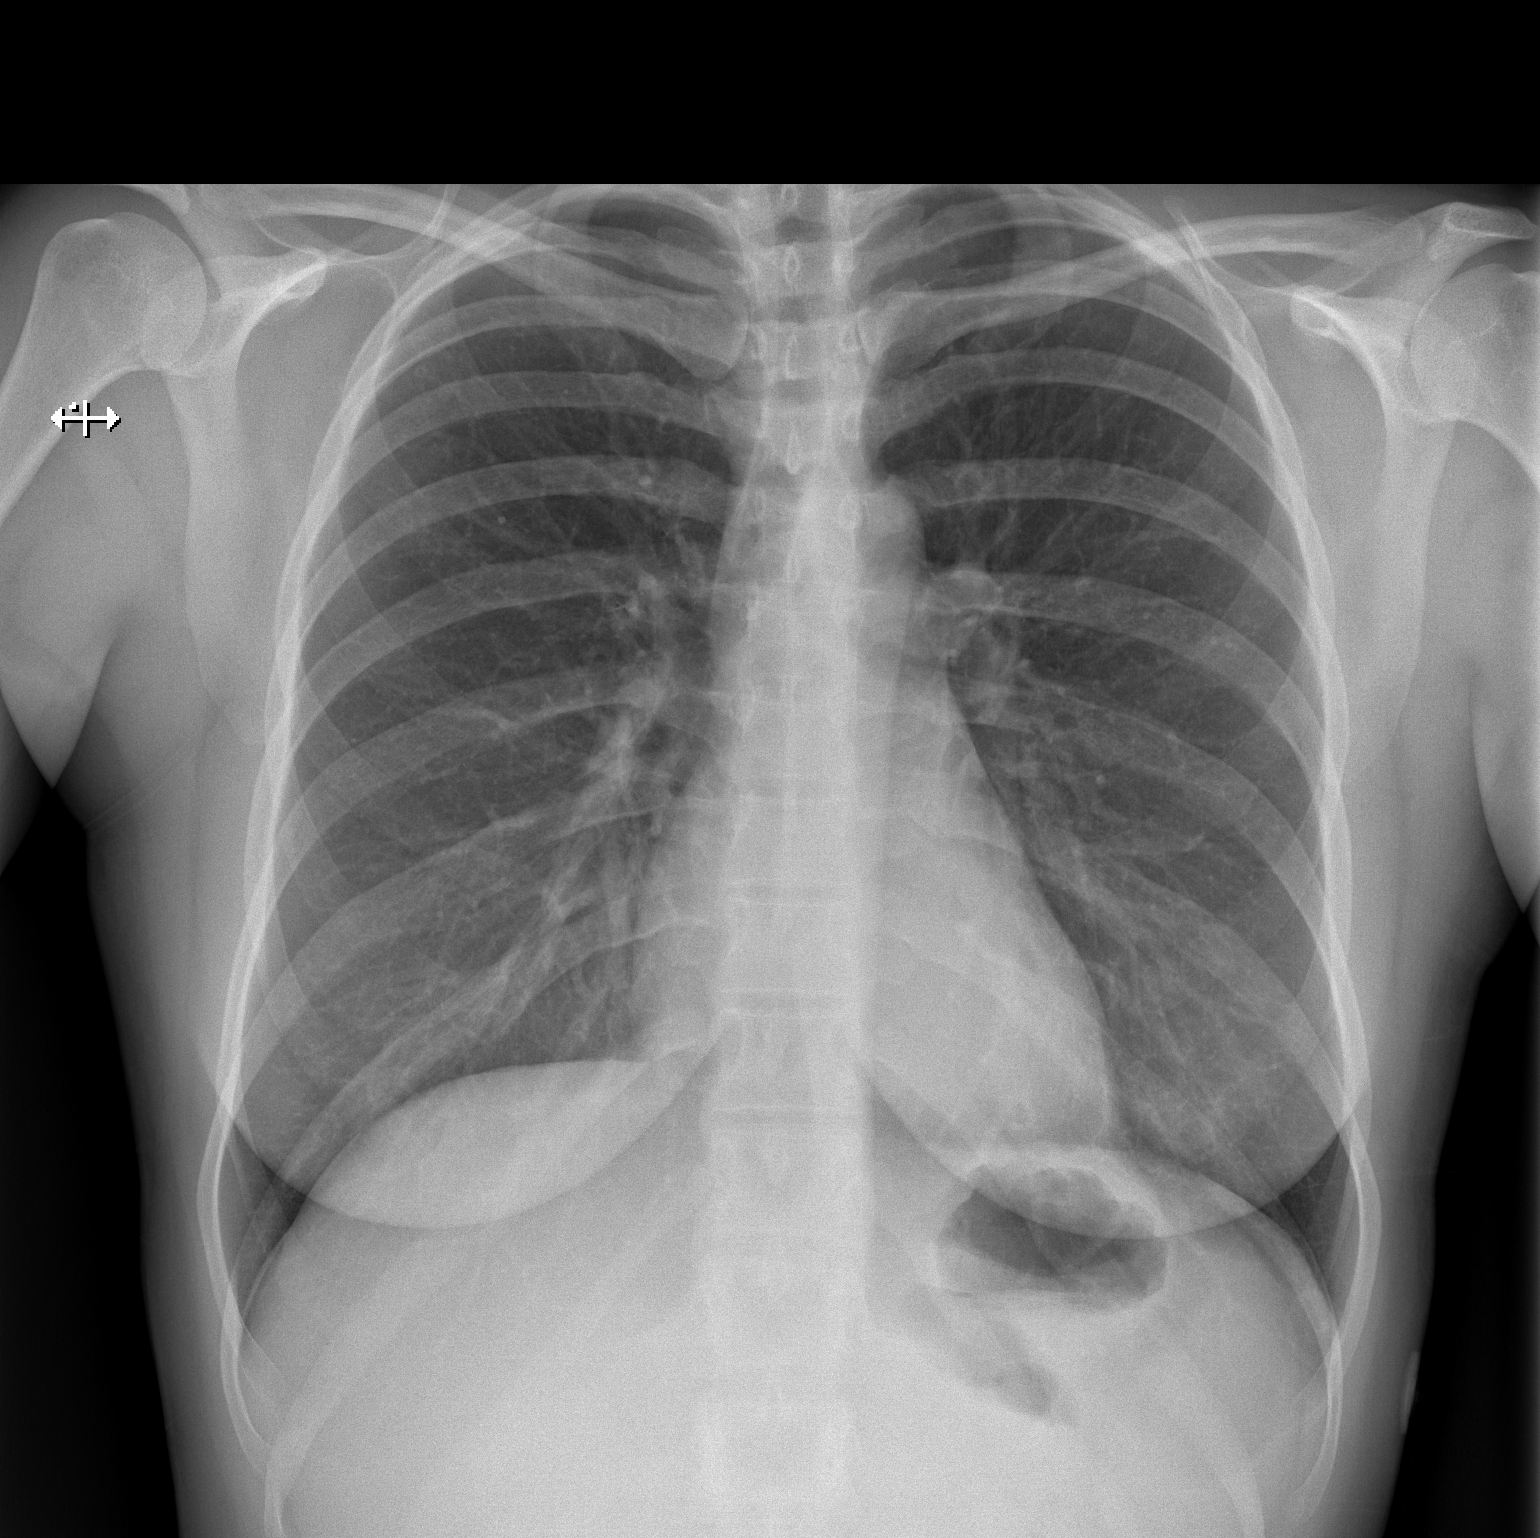

[w chest lat]
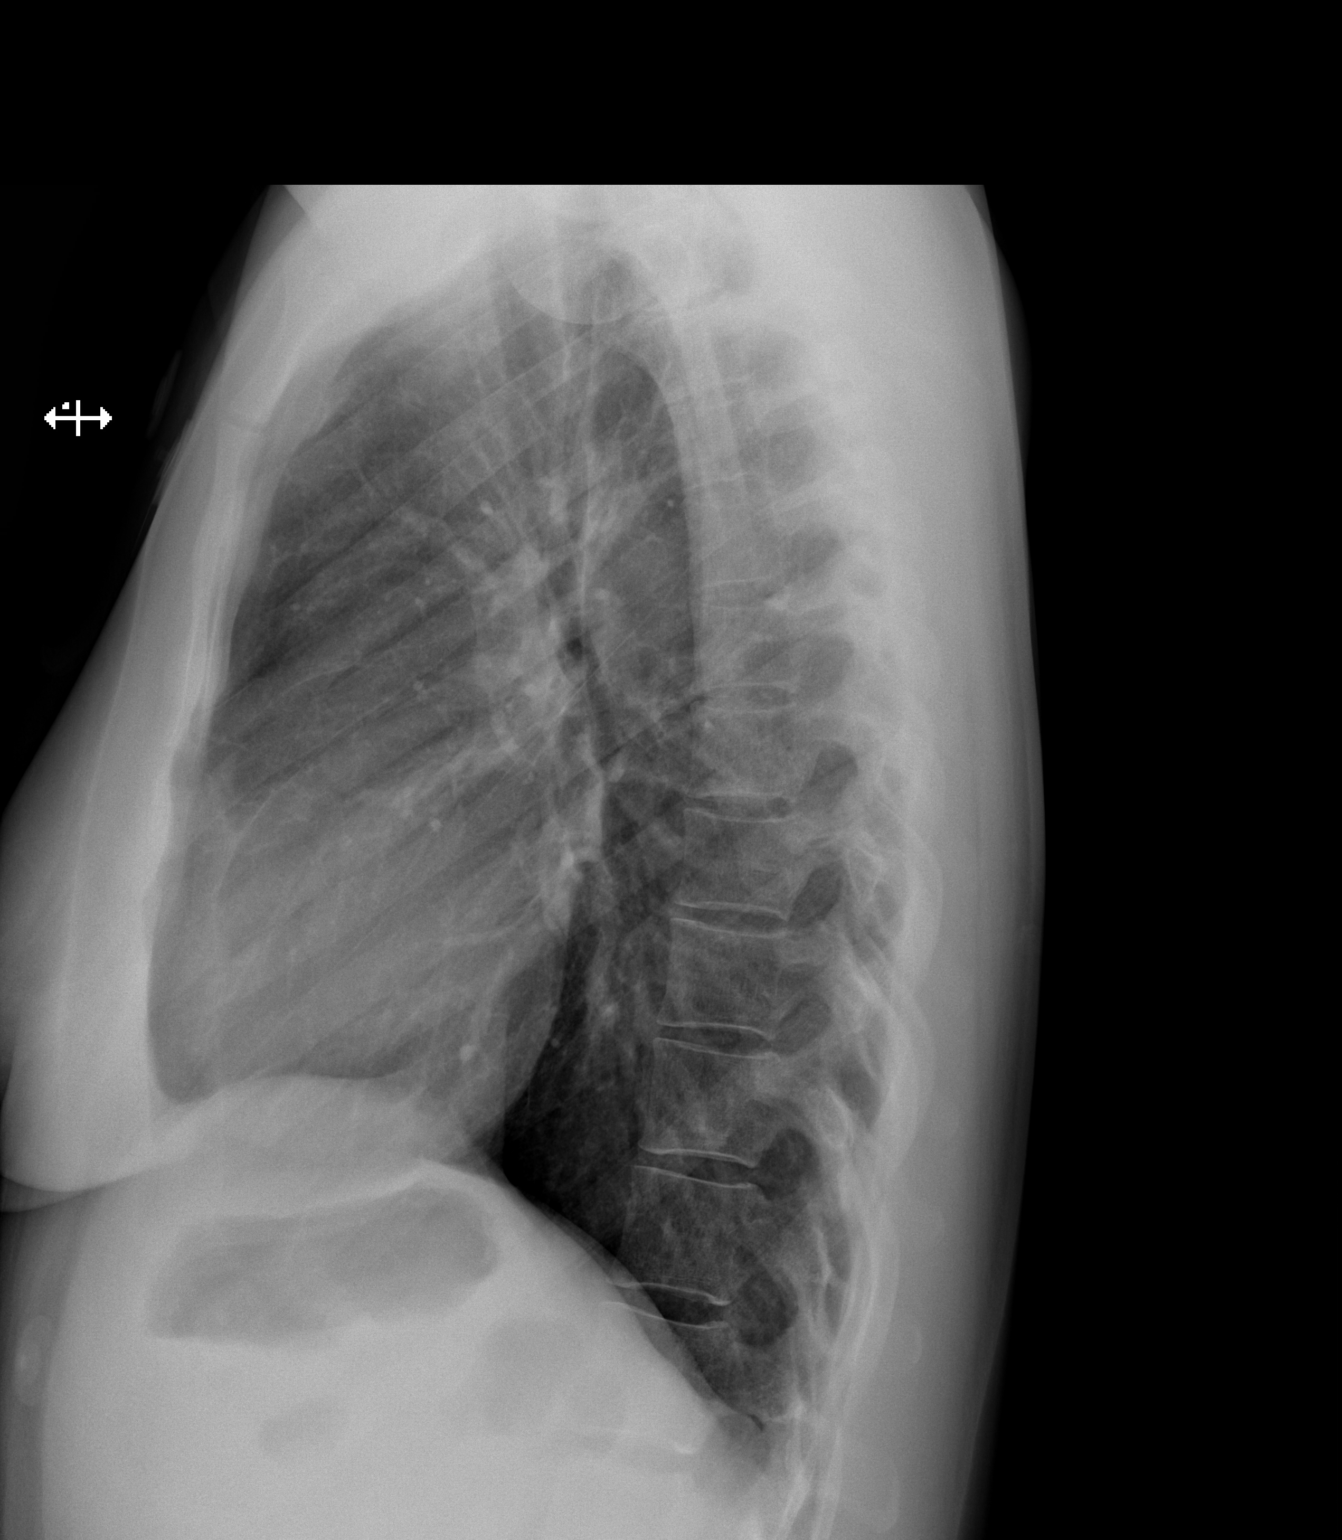

[2 of 2 positions shown; findings below may reference images not displayed]

FINDINGS: Heart size and mediastinal contours are stable. Lungs are clear. No
pleural effusion or pneumothorax seen. Osseous structures about the
chest are unremarkable.
IMPRESSION: No active cardiopulmonary disease. No evidence of pneumonia or
pulmonary edema.

## 2019-08-01 ENCOUNTER — Telehealth: Payer: Self-pay | Admitting: Cardiology
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
## 2019-08-01 NOTE — Telephone Encounter (Signed)
Spoke with Angela Guzman who has c/o increased episodes of SVT. She is calling to request refills of metoprolol without seeing a physician. Angela Guzman has not been seen by the clinic since 2017 and Dr. Caryl Comes since 2015. I informed Angela Guzman she would need to establish care to discuss recent sx and to request refills. I did suggest she call her most recent cardiologist at [facility], last seen in 2019. She declined stating "I dont want to see him because he said I didn't have SVT." I advised Angela Guzman I could schedule her a new patient appointment with Dr. Caryl Comes next week who could re-evaluate her SVT. She has agreed.  In the meantime, she understands if she has any episodes of symptomatic SVT that does not self terminate, she should report the the ED for evaluation.  She verbalized understanding and had no additional questions.

## 2019-08-09 ENCOUNTER — Other Ambulatory Visit: Payer: Self-pay

## 2019-08-09 ENCOUNTER — Ambulatory Visit (INDEPENDENT_AMBULATORY_CARE_PROVIDER_SITE_OTHER): Payer: Self-pay | Admitting: Internal Medicine

## 2019-08-09 ENCOUNTER — Encounter: Payer: Self-pay | Admitting: Internal Medicine

## 2019-08-09 VITALS — BP 122/72 | HR 85 | Ht 67.0 in | Wt 164.0 lb

## 2019-08-09 DIAGNOSIS — I471 Supraventricular tachycardia: Secondary | ICD-10-CM

## 2019-08-09 NOTE — Progress Notes (Signed)
ELECTROPHYSIOLOGY CONSULT NOTE  Patient ID: Angela Guzman, MRN: [id number], DOB/AGE: 04-08-1991 29 y.o. Admit date: (Not on file) Date of Consult: 08/09/2019  Primary Physician: Caren Macadam, MD Primary Cardiologist:      Angela Guzman is a 29 y.o. female who is being seen today for the evaluation  of SVT.    HPI Angela Guzman is a 29 y.o. female with a long hx of tachypalpitations dating back to 29 yrs old Was quiescient until after the birth of her first child at age 60; and since then has increasingly frequent events-- the details are sparse, because she cant remember-- which ER she has been to etc; but doesn't remember where.  Notes from [facility] describe adenosine sensitive tachycardia which she also describes.   An ECG is found in the: Records dated 10/14 demonstrating narrow QRS tachycardia at a cycle length of 250 ms.  There is an RSR prime in lead V1 which is absent on the subsequent posttermination ECG  More recently she has had tachypalpitations which is sometimes but not always gives rise to "attacks ".  The term "attacks "refers to heart rates in the 250s at require emergency room and adenosine.  The other tachypalpitations are associated with lightheadedness nausea presyncope and the heart rates are typically in the 110-30 range.  She has no shower intolerance.  No standing intolerance.  Does have orthostatic lightheadedness.  Exercise intolerance is associated with tachypalpitations.  This is been more prominent of late.  2014  echocardiogram was normal    Past Medical History:  Diagnosis Date  . Allergic rhinitis   . SVT (supraventricular tachycardia) ([place])   . Tobacco abuse   . UTI (lower urinary tract infection)       Surgical History:  Past Surgical History:  Procedure Laterality Date  . NO PAST SURGERIES       Home Meds: No outpatient medications have been marked as taking for the 08/09/19 encounter (Office Visit) with  Deboraha Sprang, MD.    Allergies: No Known Allergies  Social History   Socioeconomic History  . Marital status: Single    Spouse name: Not on file  . Number of children: 1  . Years of education: Not on file  . Highest education level: Not on file  Occupational History    Employer: [employer]  Tobacco Use  . Smoking status: Former Smoker    Packs/day: 0.50    Types: Cigarettes    Quit date: 01/15/2014    Years since quitting: 5.5  . Smokeless tobacco: Never Used  Substance and Sexual Activity  . Alcohol use: Yes    Comment: occasionally  . Drug use: No  . Sexual activity: Yes  Other Topics Concern  . Not on file  Social History Narrative  . Not on file   Social Determinants of Health   Financial Resource Strain:   . Difficulty of Paying Living Expenses: Not on file  Food Insecurity:   . Worried About Charity fundraiser in the Last Year: Not on file  . Ran Out of Food in the Last Year: Not on file  Transportation Needs:   . Lack of Transportation (Medical): Not on file  . Lack of Transportation (Non-Medical): Not on file  Physical Activity:   . Days of Exercise per Week: Not on file  . Minutes of Exercise per Session: Not on file  Stress:   . Feeling of Stress : Not on file  Social Connections:   . Frequency of Communication with Friends and Family: Not on file  . Frequency of Social Gatherings with Friends and Family: Not on file  . Attends Religious Services: Not on file  . Active Member of Clubs or Organizations: Not on file  . Attends Archivist Meetings: Not on file  . Marital Status: Not on file  Intimate Partner Violence:   . Fear of Current or Ex-Partner: Not on file  . Emotionally Abused: Not on file  . Physically Abused: Not on file  . Sexually Abused: Not on file     Family History  Problem Relation Age of Onset  . Hypertension Paternal Grandmother   . Diabetes Paternal Grandmother   . Alzheimer's disease Paternal Grandmother   .  Heart Problems Paternal Aunt        SVT  . Cancer Paternal Aunt        skin  . Heart Problems Maternal Grandfather        SVT  . Alcohol abuse Mother   . Alcohol abuse Father   . Throat cancer Paternal Grandfather        pipe cancer     ROS:  Please see the history of present illness.     All other systems reviewed and negative.    Physical Exam:  Blood pressure 122/72, pulse 85, height 5\' 7"  (1.702 m), weight 164 lb (74.4 kg), SpO2 98 %. General: Well developed, well nourished female in no acute distress. Head: Normocephalic, atraumatic, sclera non-icteric, no xanthomas, nares are without discharge. EENT: normal  Lymph Nodes:  none Neck: Negative for carotid bruits. JVD not elevated. Back:without scoliosis kyphosis Lungs: Clear bilaterally to auscultation without wheezes, rales, or rhonchi. Breathing is unlabored. Heart: RRR with S1 S2. No  murmur . No rubs, or gallops appreciated. Abdomen: Soft, non-tender, non-distended with normoactive bowel sounds. No hepatomegaly. No rebound/guarding. No obvious abdominal masses. Msk:  Strength and tone appear normal for age. Extremities: No clubbing or cyanosis.  Next week as to what I found over the weekend edema.  Distal pedal pulses are 2+ and equal bilaterally. Skin: Warm and Dry Neuro: Alert and oriented X 3. CN III-XII intact Grossly normal sensory and motor function . Psych:  Responds to questions appropriately with a normal affect.      Labs: Cardiac Enzymes No results for input(s): CKTOTAL, CKMB, TROPONINI in the last 72 hours. CBC Lab Results  Component Value Date   WBC 6.7 12/19/2017   HGB 14.4 12/19/2017   HCT 41.7 12/19/2017   MCV 89.9 12/19/2017   PLT 190 12/19/2017   PROTIME: No results for input(s): LABPROT, INR in the last 72 hours. Chemistry No results for input(s): NA, K, CL, CO2, BUN, CREATININE, CALCIUM, PROT, BILITOT, ALKPHOS, ALT, AST, GLUCOSE in the last 168 hours.  Invalid input(s): LABALBU Lipids No  results found for: CHOL, HDL, LDLCALC, TRIG BNP No results found for: PROBNP Thyroid Function Tests: No results for input(s): TSH, T4TOTAL, T3FREE, THYROIDAB in the last 72 hours.  Invalid input(s): FREET3 Miscellaneous Lab Results  Component Value Date   DDIMER <0.27 01/20/2016    Radiology/Studies:  No results found.  EKG: sinus @ 85 06/25/36  April 25 2013 narrow QRS tachycardia.  RSR prime lead V1 cycle length 250 ms; R prime absent on postconversion ECG). Assessment and Plan:  Tachycardia by history-adenosine sensitive  Tachycardia, flushing, shortness of breath separate from the above   The patient has 2 types of episodes of tachycardia.  The first sounds like it may be SVT.  Reviewing the notes from [facility] they also thought so; EP referral was recommended but I do not see that it was never consummated.  She tells me now that I saw her in 2014.  I did not remember this.  It was shortly after the aforementioned episode at which time we discussed catheter ablation.  She is amenable to such, we will refer her to Dr. Elliot Cousin.  Following ablation, will need to follow-up her other symptoms that suggest some degree of dysautonomia  Do not see anything in the [facility] records over the last . 5 years.  We will go back further as I have more time.  Catheter ablation would be an appropriate consideration if documented SVT.  The other symptoms may be dysautonomia but would like to solve the more important problem first      Virl Axe

## 2019-08-09 NOTE — Patient Instructions (Signed)
Medication Instructions:  None ordered.  *If you need a refill on your cardiac medications before your next appointment, please call your pharmacy*  Lab Work: None ordered.  If you have labs (blood work) drawn today and your tests are completely normal, you will receive your results only by: Marland Kitchen MyChart Message (if you have MyChart) OR . A paper copy in the mail If you have any lab test that is abnormal or we need to change your treatment, we will call you to review the results.  Testing/Procedures: None ordered.   Follow-Up: At [facility], you and your health needs are our priority.  As part of our continuing mission to provide you with exceptional heart care, we have created designated Provider Care Teams.  These Care Teams include your primary Cardiologist (physician) and Advanced Practice Providers (APPs -  Physician Assistants and Nurse Practitioners) who all work together to provide you with the care you need, when you need it.  Your next appointment:  You will be contacted regarding follow up.

## 2019-08-14 NOTE — Addendum Note (Signed)
Addended by: Rose Phi on: 08/14/2019 05:08 PM   Modules accepted: Orders

## 2019-08-29 ENCOUNTER — Institutional Professional Consult (permissible substitution): Payer: Self-pay | Admitting: Internal Medicine

## 2019-09-19 ENCOUNTER — Institutional Professional Consult (permissible substitution): Payer: Self-pay | Admitting: Internal Medicine

## 2019-12-29 ENCOUNTER — Encounter (HOSPITAL_COMMUNITY): Payer: Self-pay | Admitting: Emergency Medicine

## 2019-12-29 ENCOUNTER — Other Ambulatory Visit: Payer: Self-pay

## 2019-12-29 ENCOUNTER — Emergency Department (HOSPITAL_COMMUNITY)
Admission: EM | Admit: 2019-12-29 | Discharge: 2019-12-29 | Disposition: A | Discharge status: Home / Self Care | Payer: Managed Care, Other (non HMO) | Attending: Emergency Medicine | Admitting: Emergency Medicine

## 2019-12-29 DIAGNOSIS — I471 Supraventricular tachycardia: Secondary | ICD-10-CM | POA: Diagnosis present

## 2019-12-29 DIAGNOSIS — Z5321 Procedure and treatment not carried out due to patient leaving prior to being seen by health care provider: Secondary | ICD-10-CM | POA: Insufficient documentation

## 2019-12-29 LAB — BASIC METABOLIC PANEL
Anion gap: 9 (ref 5–15)
BUN: 8 mg/dL (ref 6–20)
CO2: 25 mmol/L (ref 22–32)
Calcium: 9.1 mg/dL (ref 8.9–10.3)
Chloride: 107 mmol/L (ref 98–111)
Creatinine, Ser: 0.79 mg/dL (ref 0.44–1.00)
GFR calc Af Amer: >60 mL/min (ref >60)
GFR calc non Af Amer: >60 mL/min (ref >60)
Glucose, Bld: 88 mg/dL (ref 70–99)
Potassium: 3.5 mmol/L (ref 3.5–5.1)
Sodium: 141 mmol/L (ref 135–145)

## 2019-12-29 LAB — CBC
HCT: 41.7 % (ref 36.0–46.0)
Hemoglobin: 14.3 g/dL (ref 12.0–15.0)
MCH: 32 pg (ref 26.0–34.0)
MCHC: 34.3 g/dL (ref 30.0–36.0)
MCV: 93.3 fL (ref 80.0–100.0)
Platelets: 242 10*3/uL (ref 150–400)
RBC: 4.47 MIL/uL (ref 3.87–5.11)
RDW: 12.3 % (ref 11.5–15.5)
WBC: 10.2 10*3/uL (ref 4.0–10.5)
nRBC: 0 % (ref 0.0–0.2)

## 2019-12-29 LAB — I-STAT BETA HCG BLOOD, ED (MC, WL, AP ONLY): I-stat hCG, quantitative: <5 m[IU]/mL (ref <5)

## 2019-12-29 LAB — TROPONIN I (HIGH SENSITIVITY): Troponin I (High Sensitivity): 17 ng/L (ref <18)

## 2019-12-29 MED ORDER — SODIUM CHLORIDE 0.9% FLUSH: 3.0000 mL | Freq: Once | INTRAVENOUS | Status: DC

## 2019-12-29 NOTE — ED Notes (Signed)
Pt states she has to go pick daughter from school and cant stay
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
## 2020-01-13 ENCOUNTER — Emergency Department (HOSPITAL_COMMUNITY)
Admission: EM | Admit: 2020-01-13 | Discharge: 2020-01-13 | Disposition: A | Discharge status: Home / Self Care | Payer: Managed Care, Other (non HMO) | Attending: Emergency Medicine | Admitting: Emergency Medicine

## 2020-01-13 ENCOUNTER — Encounter (HOSPITAL_COMMUNITY): Payer: Self-pay | Admitting: Emergency Medicine

## 2020-01-13 ENCOUNTER — Other Ambulatory Visit: Payer: Self-pay

## 2020-01-13 ENCOUNTER — Emergency Department (HOSPITAL_COMMUNITY): Payer: Managed Care, Other (non HMO)

## 2020-01-13 ENCOUNTER — Telehealth: Payer: Self-pay | Admitting: Internal Medicine

## 2020-01-13 DIAGNOSIS — R42 Dizziness and giddiness: Secondary | ICD-10-CM | POA: Insufficient documentation

## 2020-01-13 DIAGNOSIS — R000 Tachycardia, unspecified: Secondary | ICD-10-CM | POA: Insufficient documentation

## 2020-01-13 DIAGNOSIS — Z5321 Procedure and treatment not carried out due to patient leaving prior to being seen by health care provider: Secondary | ICD-10-CM | POA: Insufficient documentation

## 2020-01-13 DIAGNOSIS — R0789 Other chest pain: Secondary | ICD-10-CM | POA: Diagnosis not present

## 2020-01-13 LAB — BASIC METABOLIC PANEL
Anion gap: 8 (ref 5–15)
BUN: 12 mg/dL (ref 6–20)
CO2: 25 mmol/L (ref 22–32)
Calcium: 9 mg/dL (ref 8.9–10.3)
Chloride: 103 mmol/L (ref 98–111)
Creatinine, Ser: 0.96 mg/dL (ref 0.44–1.00)
GFR calc Af Amer: >60 mL/min (ref >60)
GFR calc non Af Amer: >60 mL/min (ref >60)
Glucose, Bld: 116 mg/dL — ABNORMAL HIGH (ref 70–99)
Potassium: 3.5 mmol/L (ref 3.5–5.1)
Sodium: 136 mmol/L (ref 135–145)

## 2020-01-13 LAB — CBC
HCT: 41.7 % (ref 36.0–46.0)
Hemoglobin: 14.3 g/dL (ref 12.0–15.0)
MCH: 31.7 pg (ref 26.0–34.0)
MCHC: 34.3 g/dL (ref 30.0–36.0)
MCV: 92.5 fL (ref 80.0–100.0)
Platelets: 223 10*3/uL (ref 150–400)
RBC: 4.51 MIL/uL (ref 3.87–5.11)
RDW: 12.5 % (ref 11.5–15.5)
WBC: 5.8 10*3/uL (ref 4.0–10.5)
nRBC: 0 % (ref 0.0–0.2)

## 2020-01-13 LAB — I-STAT BETA HCG BLOOD, ED (MC, WL, AP ONLY): I-stat hCG, quantitative: <5 m[IU]/mL (ref <5)

## 2020-01-13 NOTE — ED Notes (Signed)
Pt stated she couldn't wait any longer and has left the premesis.l

## 2020-01-13 NOTE — ED Triage Notes (Signed)
Pt reports hx of SVT, pt states last episode was 6/13, seen here, states for the past 2 weeks she feels like her heart is beating really hard, causing her to have cp and lightheadedness, not currently on her metoprolol. Spoke with nurse at cards who advised her to come here. A/ox4, resp e/u.

## 2020-01-13 NOTE — Telephone Encounter (Signed)
Pt complaining of active chest tightness and states she feels as if she is going to have another episode of SVT.  Pt reports she had an episode a couple of weeks ago and went to ED but could not stay.  Pt advised with active chest discomfort and increased episodes of SVT she should go to ED for further evaluation.  Pt states she has weaned herself off Metoprolol and is hoping it will be prescribed once again.  Pt states she will have to talk to her boss about taking the time to go to ED.  Pt advised she should have someone drive her or she should call 911 but to not drive herself.  Pt verbalizes understanding and agrees with current plan.  Pt has appointment scheduled with Dr Caryl Comes 01/16/2020 at 2pm for virtual visit.

## 2020-01-13 NOTE — Telephone Encounter (Signed)
Pt c/o of Chest Pain: STAT if CP now or developed within 24 hours  1. Are you having CP right now? yes  2. Are you experiencing any other symptoms (ex. SOB, nausea, vomiting, sweating)? SOB- not at this time 3. How long have you been experiencing CP?2 weeks - went to the ER- having a lot of SVT's  4. Is your CP continuous or coming and going?comes and goes  5. Have you taken Nitroglycerin? no ?

## 2020-01-16 ENCOUNTER — Telehealth: Payer: Self-pay

## 2020-01-16 ENCOUNTER — Telehealth (INDEPENDENT_AMBULATORY_CARE_PROVIDER_SITE_OTHER): Payer: Managed Care, Other (non HMO) | Admitting: Internal Medicine

## 2020-01-16 VITALS — Ht 67.0 in | Wt 163.0 lb

## 2020-01-16 DIAGNOSIS — I471 Supraventricular tachycardia: Secondary | ICD-10-CM | POA: Diagnosis not present

## 2020-01-16 NOTE — Patient Instructions (Signed)
Medication Instructions:  Your physician recommends that you continue on your current medications as directed. Please refer to the Current Medication list given to you today.  *If you need a refill on your cardiac medications before your next appointment, please call your pharmacy*   Lab Work: None ordered.  If you have labs (blood work) drawn today and your tests are completely normal, you will receive your results only by: Marland Kitchen MyChart Message (if you have MyChart) OR . A paper copy in the mail If you have any lab test that is abnormal or we need to change your treatment, we will call you to review the results.   Testing/Procedures: None ordered.    Follow-Up: At [facility], you and your health needs are our priority.  As part of our continuing mission to provide you with exceptional heart care, we have created designated Provider Care Teams.  These Care Teams include your primary Cardiologist (physician) and Advanced Practice Providers (APPs -  Physician Assistants and Nurse Practitioners) who all work together to provide you with the care you need, when you need it.  We recommend signing up for the patient portal called "MyChart".  Sign up information is provided on this After Visit Summary.  MyChart is used to connect with patients for Virtual Visits (Telemedicine).  Patients are able to view lab/test results, encounter notes, upcoming appointments, etc.  Non-urgent messages can be sent to your provider as well.   To learn more about what you can do with MyChart, go to [email or web address].    Your next appointment:  3 month virtual visit with Dr Caryl Comes.  You will receive a letter to remind you to schedule.

## 2020-01-16 NOTE — Telephone Encounter (Signed)
  Patient Consent for Virtual Visit         Angela Guzman has provided verbal consent on 01/16/2020 for a virtual visit (video or telephone).   CONSENT FOR VIRTUAL VISIT FOR:  Angela Guzman  By participating in this virtual visit I agree to the following:  I hereby voluntarily request, consent and authorize [facility] and its employed or contracted physicians, physician assistants, nurse practitioners or other licensed health care professionals (the Practitioner), to provide me with telemedicine health care services (the "Services") as deemed necessary by the treating Practitioner. I acknowledge and consent to receive the Services by the Practitioner via telemedicine. I understand that the telemedicine visit will involve communicating with the Practitioner through live audiovisual communication technology and the disclosure of certain medical information by electronic transmission. I acknowledge that I have been given the opportunity to request an in-person assessment or other available alternative prior to the telemedicine visit and am voluntarily participating in the telemedicine visit.  I understand that I have the right to withhold or withdraw my consent to the use of telemedicine in the course of my care at any time, without affecting my right to future care or treatment, and that the Practitioner or I may terminate the telemedicine visit at any time. I understand that I have the right to inspect all information obtained and/or recorded in the course of the telemedicine visit and may receive copies of available information for a reasonable fee.  I understand that some of the potential risks of receiving the Services via telemedicine include:  Marland Kitchen Delay or interruption in medical evaluation due to technological equipment failure or disruption; . Information transmitted may not be sufficient (e.g. poor resolution of images) to allow for appropriate medical decision making by the  Practitioner; and/or  . In rare instances, security protocols could fail, causing a breach of personal health information.  Furthermore, I acknowledge that it is my responsibility to provide information about my medical history, conditions and care that is complete and accurate to the best of my ability. I acknowledge that Practitioner's advice, recommendations, and/or decision may be based on factors not within their control, such as incomplete or inaccurate data provided by me or distortions of diagnostic images or specimens that may result from electronic transmissions. I understand that the practice of medicine is not an exact science and that Practitioner makes no warranties or guarantees regarding treatment outcomes. I acknowledge that a copy of this consent can be made available to me via my patient portal ([place]), or I can request a printed copy by calling the office of [facility].    I understand that my insurance will be billed for this visit.   I have read or had this consent read to me. . I understand the contents of this consent, which adequately explains the benefits and risks of the Services being provided via telemedicine.  . I have been provided ample opportunity to ask questions regarding this consent and the Services and have had my questions answered to my satisfaction. . I give my informed consent for the services to be provided through the use of telemedicine in my medical care

## 2020-01-16 NOTE — Progress Notes (Signed)
Electrophysiology TeleHealth Note   Due to national recommendations of social distancing due to COVID 19, an audio/video telehealth visit is felt to be most appropriate for this patient at this time.  See MyChart message from today for the patient's consent to telehealth for [facility].   Date:  01/16/2020   ID:  Saidah Kempton, DOB [birth year], MRN [id number]  Location: patient's home  Provider location: [address], [place]  Evaluation Performed: Follow-up visit  PCP:  Caren Macadam, MD  Cardiologist:    Electrophysiologist:  SK   Chief Complaint: SVT  History of Present Illness:    Fia Hebert is a 29 y.o. female who presents via audio/video conferencing for a telehealth visit today.  Since last being seen in our clinic for SVT w 2 distinct clinical syndromes, one likley reentrant the other sounding like dysautonomia  the patient reports recurrent daily palps and then infrequent SVT inclu 6/21 prompting call to EMS See Below  Sx of CP SOB and Presyncope  Recorded BP with HR 160 was 140/70s  No clear triggers   Reluctant to undergo procedure ( with two small kids(    The patient denies symptoms of fevers, chills, cough, or new SOB worrisome for COVID 19.    Past Medical History:  Diagnosis Date  . Allergic rhinitis   . SVT (supraventricular tachycardia) ([place])   . Tobacco abuse   . UTI (lower urinary tract infection)     Past Surgical History:  Procedure Laterality Date  . NO PAST SURGERIES      No current outpatient medications on file.   No current facility-administered medications for this visit.    Allergies:   Patient has no known allergies.   Social History:  The patient  reports that she quit smoking about 6 years ago. Her smoking use included cigarettes. She smoked 0.50 packs per day. She has never used smokeless tobacco. She reports current alcohol use. She reports that she does not use drugs.   Family History:   The patient's   family history includes Alcohol abuse in her father and mother; Alzheimer's disease in her paternal grandmother; Cancer in her paternal aunt; Diabetes in her paternal grandmother; Heart Problems in her maternal grandfather and paternal aunt; Hypertension in her paternal grandmother; Throat cancer in her paternal grandfather.   ROS:  Please see the history of present illness.   All other systems are personally reviewed and negative.    Exam:    Vital Signs:  Ht _0  (1.702 m)   Wt 163 lb (73.9 kg)   BMI 25.53 kg/m     Well appearing, alert and conversant, regular work of breathing,  good skin color Eyes- anicteric, neuro- grossly intact, skin- no apparent rash or lesions or cyanosis, mouth- oral mucosa is pink   Labs/Other Tests and Data Reviewed:    Recent Labs: 01/13/2020: BUN 12; Creatinine, Ser 0.96; Hemoglobin 14.3; Platelets 223; Potassium 3.5; Sodium 136   Wt Readings from Last 3 Encounters:  01/16/20 163 lb (73.9 kg)  12/29/19 170 lb (77.1 kg)  08/09/19 164 lb (74.4 kg)     Other studies personally reviewed: Additional studies/ records that were reviewed today include:EMS record 6/21>> SVT CL 250 msec or so    ASSESSMENT & PLAN:    SVT -adenosine responsive  TAchyPalps ? dysautonomia   Recurrent symptomatic SVT with rapdi CL and presyncope.  She is reluctant to proceed with RFCA at this juncture so  we have discussed medical therapy to be taken on a daily basis  She has an issue with pill size so will give her a list to take to pharmacy to rank by size Verapamil 120 cd dilt 120 CD Metop succ 25 Atenolol 25 Met tart 25 bid Bisoprolol 2.5 nebivilol 2.5   She will call us back following her survey and will Rx     COVID 19 screen The patient denies symptoms of COVID 19 at this time.  The importance of social distancing was discussed today.  Follow-up:  32mOV or telehealth visit    Current medicines are reviewed at length with the patient  today.   The patient does not have concerns regarding her medicines.  The following changes were made today:  As above   Labs/ tests ordered today include:   No orders of the defined types were placed in this encounter.      Patient Risk:  after full review of this patients clinical status, I feel that they are at moderate  risk at this time.  Today, I have spent *9 minutes with the patient with telehealth technology discussing the above.  Signed, SVirl Axe MD  01/16/2020 2:32 PM     [address] [phone]) [phone](fax)

## 2020-01-17 ENCOUNTER — Telehealth: Payer: Self-pay | Admitting: Internal Medicine

## 2020-01-17 MED ORDER — ATENOLOL 25 MG PO TABS
25.0000 mg | ORAL_TABLET | Freq: Every day | ORAL | 3 refills | Status: DC
Start: 2020-01-17 — End: 2020-02-24

## 2020-01-17 NOTE — Telephone Encounter (Signed)
Spoke briefly with pt before phone call was disconnected.  Attempted to call pt back and call went to voicemail.  MyCHart message sent to pt to let her know per Dr Caryl Comes, Atenolol 25mg  - 1 tablet daily has been sent to pharmacy as requested.

## 2020-01-17 NOTE — Addendum Note (Signed)
Addended by: Thora Lance on: 01/17/2020 06:08 PM   Modules accepted: Orders

## 2020-01-17 NOTE — Telephone Encounter (Signed)
Patient returned your call.

## 2020-01-17 NOTE — Telephone Encounter (Signed)
MyChart message sent to pt with list of medications per Dr Caryl Comes.  See message for complete details.

## 2020-01-17 NOTE — Telephone Encounter (Signed)
Patient states that Dr. Caryl Comes and his nurse were supposed to be sending her a list of medications and she wants to know when that will be sent. Please advise.

## 2020-01-28 ENCOUNTER — Ambulatory Visit: Payer: Managed Care, Other (non HMO) | Admitting: Student

## 2020-02-18 ENCOUNTER — Telehealth: Payer: Self-pay

## 2020-02-18 NOTE — Telephone Encounter (Signed)
Spoke with pt who states the Metoprolol Succinate which she took once daily seemed to work best at controlling her palpitations. Will contact pt with recommendation once PA reviews.  Pt verbalizes understanding and agrees with current plan.

## 2020-02-18 NOTE — Telephone Encounter (Signed)
Spoke with pt who states after she takes her Atenolol in the am she develops feeling lightheaded and chest tightness.  She reports these symptoms have been ongoing for about 6 days now. Medication was started Symptoms do eventually resolve.  Pt denies current CP/tightness but has taken Atenolol this am.  Pt would like to start back on Metoprolol as she did not have any side effects with that medication.  Will forward to Tommye Standard PA-C for review and recommendation as Dr Caryl Comes is out of office until next week.  Pt verbalizes understanding and agrees with current plan.

## 2020-02-24 MED ORDER — METOPROLOL SUCCINATE ER 25 MG PO TB24
25.0000 mg | ORAL_TABLET | Freq: Every day | ORAL | 3 refills | Status: DC
Start: 2020-02-24 — End: 2021-02-12

## 2020-03-06 ENCOUNTER — Ambulatory Visit: Payer: Managed Care, Other (non HMO) | Admitting: Physician Assistant

## 2020-03-06 NOTE — Progress Notes (Deleted)
Cardiology Office Note Date:  03/06/2020  Patient ID:  Angela Guzman, Angela Guzman 05/31/1991, MRN [id number] PCP:  Caren Macadam, MD  Cardiologist:  Dr. Caryl Comes  ***refresh   Chief Complaint: *** f/u  History of Present Illness: Angela Guzman is a 29 y.o. female with history of SVT and likely/suspect dysautonomia, anxiety.  She comes today to be seen for dr. Caryl Comes, last seen by him via video visit 01/16/2020 He discussed  Recurrent symptomatic SVT with rapdi CL and presyncope.  She is reluctant to proceed with RFCA at this juncture so we have discussed medical therapy to be taken on a daily basis  She has an issue with pill size so will give her a list to take to pharmacy to rank by size Verapamil 120 cd dilt 120 CD Metop succ 25 Atenolol 25 Met tart 25 bid Bisoprolol 2.5 nebivilol 2.5   Decided to start Metoprolol succ 58m daily via phone notes with RN and myself, having worked previously for her.  She saw family medicine 03/05/20 Discussed generalized anxiety disorder, panic, and nausea Started on fluoxetine and zofran   *** smoking? *** metoprolol/ *** BP, HR *** labs, lipids in June *** triggers, pregnant? ETOH  Past Medical History:  Diagnosis Date  . Allergic rhinitis   . [place]   . Tobacco abuse   . UTI (lower urinary tract infection)     Past Surgical History:  Procedure Laterality Date  . NO PAST SURGERIES      Current Outpatient Medications  Medication Sig Dispense Refill  . metoprolol succinate (TOPROL XL) 25 MG 24 hr tablet Take 1 tablet (25 mg total) by mouth daily. 90 tablet 3   No current facility-administered medications for this visit.    Allergies:   Patient has no known allergies.   Social History:  The patient  reports that she quit smoking about 6 years ago. Her smoking use included cigarettes. She smoked 0.50 packs per day. She has never used smokeless tobacco. She reports current alcohol  use. She reports that she does not use drugs.   Family History:  The patient's family history includes Alcohol abuse in her father and mother; Alzheimer's disease in her paternal grandmother; Cancer in her paternal aunt; Diabetes in her paternal grandmother; Heart Problems in her maternal grandfather and paternal aunt; Hypertension in her paternal grandmother; Throat cancer in her paternal grandfather.  ROS:  Please see the history of present illness. All other systems are reviewed and otherwise negative.   PHYSICAL EXAM: *** VS:  There were no vitals taken for this visit. BMI: There is no height or weight on file to calculate BMI. Well nourished, well developed, in no acute distress  HEENT: normocephalic, atraumatic  Neck: no JVD, carotid bruits or masses Cardiac:  *** RRR; no significant murmurs, no rubs, or gallops Lungs:  *** CTA b/l, no wheezing, rhonchi or rales  Abd: soft, nontender MS: no deformity or *** atrophy Ext: *** no edema  Skin: warm and dry, no rash Neuro:  No gross deficits appreciated Psych: euthymic mood, full affect   EKG:  Not done today  07/30/2012; TTE Study Conclusions  - Left ventricle: The cavity size was normal. Wall thickness  was normal. Systolic function was normal. The estimated  ejection fraction was in the range of 55% to 65%. Wall  motion was normal; there were no regional wall motion  abnormalities. Left ventricular diastolic function  parameters were normal.  - Mitral valve: Systolic  bowing without prolapse.  - Atrial septum: No defect or patent foramen ovale was  identified.    Recent Labs: 01/13/2020: BUN 12; Creatinine, Ser 0.96; Hemoglobin 14.3; Platelets 223; Potassium 3.5; Sodium 136  No results found for requested labs within last 8760 hours.   CrCl cannot be calculated (Patient's most recent lab result is older than the maximum 21 days allowed.).   Wt Readings from Last 3 Encounters:  01/16/20 163 lb (73.9 kg)    12/29/19 170 lb (77.1 kg)  08/09/19 164 lb (74.4 kg)     Other studies reviewed: Additional studies/records reviewed today include: summarized above  ASSESSMENT AND PLAN:  1. SVT 2. +/- dysautonomia     *** BB     *** suspect tx for her GAD will also help     Disposition: F/u with ***  Current medicines are reviewed at length with the patient today.  The patient did not have any concerns regarding medicines.***  Signed, Renee Ursuy, PA-C 03/06/2020 5:22 AM     [facility] [address] [place] [address] [phone] (office)  [phone] (fax)   

## 2020-07-18 ENCOUNTER — Emergency Department (HOSPITAL_BASED_OUTPATIENT_CLINIC_OR_DEPARTMENT_OTHER)
Admission: EM | Admit: 2020-07-18 | Discharge: 2020-07-18 | Disposition: A | Discharge status: Home / Self Care | Payer: Managed Care, Other (non HMO) | Attending: Emergency Medicine | Admitting: Emergency Medicine

## 2020-07-18 ENCOUNTER — Encounter (HOSPITAL_BASED_OUTPATIENT_CLINIC_OR_DEPARTMENT_OTHER): Payer: Self-pay

## 2020-07-18 ENCOUNTER — Other Ambulatory Visit: Payer: Self-pay

## 2020-07-18 DIAGNOSIS — R1110 Vomiting, unspecified: Secondary | ICD-10-CM | POA: Diagnosis not present

## 2020-07-18 DIAGNOSIS — R109 Unspecified abdominal pain: Secondary | ICD-10-CM | POA: Diagnosis not present

## 2020-07-18 DIAGNOSIS — K921 Melena: Secondary | ICD-10-CM | POA: Diagnosis not present

## 2020-07-18 DIAGNOSIS — Z5321 Procedure and treatment not carried out due to patient leaving prior to being seen by health care provider: Secondary | ICD-10-CM | POA: Insufficient documentation

## 2020-07-18 NOTE — ED Triage Notes (Signed)
Pt states abdominal pain and blood in stool with each BM  x1 month.  Had virtual visit with pmd, has not followed up.

## 2020-07-18 NOTE — ED Notes (Signed)
Name called.  No answer.

## 2020-08-09 ENCOUNTER — Emergency Department (HOSPITAL_BASED_OUTPATIENT_CLINIC_OR_DEPARTMENT_OTHER): Payer: Managed Care, Other (non HMO)

## 2020-08-09 ENCOUNTER — Other Ambulatory Visit: Payer: Self-pay

## 2020-08-09 ENCOUNTER — Emergency Department (HOSPITAL_BASED_OUTPATIENT_CLINIC_OR_DEPARTMENT_OTHER)
Admission: EM | Admit: 2020-08-09 | Discharge: 2020-08-10 | Disposition: A | Discharge status: Home / Self Care | Payer: Managed Care, Other (non HMO) | Attending: Emergency Medicine | Admitting: Emergency Medicine

## 2020-08-09 ENCOUNTER — Encounter (HOSPITAL_BASED_OUTPATIENT_CLINIC_OR_DEPARTMENT_OTHER): Payer: Self-pay | Admitting: *Deleted

## 2020-08-09 DIAGNOSIS — E876 Hypokalemia: Secondary | ICD-10-CM | POA: Diagnosis not present

## 2020-08-09 DIAGNOSIS — Z87891 Personal history of nicotine dependence: Secondary | ICD-10-CM | POA: Insufficient documentation

## 2020-08-09 DIAGNOSIS — Z79899 Other long term (current) drug therapy: Secondary | ICD-10-CM | POA: Diagnosis not present

## 2020-08-09 DIAGNOSIS — F1010 Alcohol abuse, uncomplicated: Secondary | ICD-10-CM | POA: Diagnosis not present

## 2020-08-09 DIAGNOSIS — R0789 Other chest pain: Secondary | ICD-10-CM | POA: Insufficient documentation

## 2020-08-09 DIAGNOSIS — R002 Palpitations: Secondary | ICD-10-CM | POA: Insufficient documentation

## 2020-08-09 LAB — PREGNANCY, URINE: Preg Test, Ur: NEGATIVE

## 2020-08-09 MED ORDER — LORAZEPAM 1 MG PO TABS
1.0000 mg | ORAL_TABLET | Freq: Once | ORAL | Status: AC
  Administered 2020-08-10: 1 mg via ORAL
  Filled 2020-08-09: qty 1

## 2020-08-09 NOTE — ED Provider Notes (Signed)
[facility] EMERGENCY DEPARTMENT Provider Note   CSN: [id number] Arrival date & time: 08/09/20  2031     History Chief Complaint  Patient presents with  . Palpitations    Angela Guzman is a 30 y.o. female.  HPI     This is a 30 year old female with a history of SVT who presents with palpitations and chest pain.  Patient reports she has had worsening palpitations and chest pain over the last day.  She has a history of SVT and feels like she has been going in and out of it frequently.  She describes chest pressure that is not worse with exertion.  She denies any fevers or shortness of breath.  No known history of blood clots or estrogen use.  She states that she has felt very shaky.  Patient takes metoprolol for SVT and states she has been taking this as directed.  She does report ongoing daily alcohol use.  She reports that she drinks 4-5 shots of daily.  She has not had anything to drink today.  No history of alcohol withdrawal or alcohol withdrawal seizures.  No known history of thyroid disorder.  Past Medical History:  Diagnosis Date  . Allergic rhinitis   . SVT (supraventricular tachycardia) ([place])   . Tobacco abuse   . UTI (lower urinary tract infection)     Patient Active Problem List   Diagnosis Date Noted  . Other normal pregnancy, not first 10/16/2014  . SVD (spontaneous vaginal delivery) 10/16/2014  . Normal pregnancy in multigravida in third trimester 10/15/2014  . Tobacco abuse   . SVT (supraventricular tachycardia) ([place]) 07/24/2012    Past Surgical History:  Procedure Laterality Date  . NO PAST SURGERIES       OB History    Gravida  2   Para  2   Term  2   Preterm  0   AB  0   Living  2     SAB  0   IAB  0   Ectopic  0   Multiple  0   Live Births  2           Family History  Problem Relation Age of Onset  . Hypertension Paternal Grandmother   . Diabetes Paternal Grandmother   . Alzheimer's disease Paternal  Grandmother   . Heart Problems Paternal Aunt        SVT  . Cancer Paternal Aunt        skin  . Heart Problems Maternal Grandfather        SVT  . Alcohol abuse Mother   . Alcohol abuse Father   . Throat cancer Paternal Grandfather        pipe cancer    Social History   Tobacco Use  . Smoking status: Former Smoker    Packs/day: 0.50    Types: Cigarettes    Quit date: 01/15/2014    Years since quitting: 6.5  . Smokeless tobacco: Never Used  Vaping Use  . Vaping Use: Never used  Substance Use Topics  . Alcohol use: Yes    Comment: occasionally  . Drug use: No    Home Medications Prior to Admission medications   Medication Sig Start Date End Date Taking? Authorizing Provider  potassium chloride 20 MEQ TBCR Take 20 mEq by mouth 2 (two) times daily. 08/10/20  Yes Reshunda Strider, Barbette Hair, MD  FLUoxetine (PROZAC) 10 MG capsule Take 10 mg by mouth daily. 06/09/20   [provider]  FLUoxetine (PROZAC) 20 MG capsule Take 20 mg by mouth daily. 07/03/20   [provider]  metoprolol succinate (TOPROL XL) 25 MG 24 hr tablet Take 1 tablet (25 mg total) by mouth daily. 02/24/20   Baldwin Jamaica, PA-C    Allergies    Patient has no known allergies.  Review of Systems   Review of Systems  Constitutional: Negative for fever.  Respiratory: Negative for shortness of breath.   Cardiovascular: Positive for chest pain and palpitations.  Gastrointestinal: Negative for abdominal pain and nausea.  Genitourinary: Negative for dysuria.  Neurological: Positive for tremors.  All other systems reviewed and are negative.   Physical Exam Updated Vital Signs BP 126/89 (BP Location: Left Arm)   Pulse 93   Temp 98.2 F (36.8 C) (Oral)   Resp 14   Ht 1.702 m (5\' 7" )   Wt 72.6 kg   LMP 06/28/2020   SpO2 100%   BMI 25.06 kg/m   Physical Exam Vitals and nursing note reviewed.  Constitutional:      Appearance: She is well-developed and well-nourished. She is not ill-appearing.   HENT:     Head: Normocephalic and atraumatic.     Nose: Nose normal.     Mouth/Throat:     Mouth: Mucous membranes are moist.  Eyes:     Pupils: Pupils are equal, round, and reactive to light.  Cardiovascular:     Rate and Rhythm: Regular rhythm. Tachycardia present.     Heart sounds: Normal heart sounds.  Pulmonary:     Effort: Pulmonary effort is normal. No respiratory distress.     Breath sounds: No wheezing.  Abdominal:     General: Bowel sounds are normal.     Palpations: Abdomen is soft.     Tenderness: There is no guarding or rebound.  Musculoskeletal:     Cervical back: Neck supple.     Right lower leg: No edema.     Left lower leg: No edema.  Skin:    General: Skin is warm and dry.  Neurological:     Mental Status: She is alert and oriented to person, place, and time.  Psychiatric:        Mood and Affect: Mood and affect normal.     Comments: Anxious appearing but nontoxic     ED Results / Procedures / Treatments   Labs (all labs ordered are listed, but only abnormal results are displayed) Labs Reviewed  BASIC METABOLIC PANEL - Abnormal; Notable for the following components:      Result Value   Potassium 3.0 (*)    All other components within normal limits  PREGNANCY, URINE  CBC WITH DIFFERENTIAL/PLATELET  D-DIMER, QUANTITATIVE (NOT AT [facility])  TSH  ETHANOL  TROPONIN I (HIGH SENSITIVITY)    EKG EKG Interpretation  Date/Time:  Sunday August 09 2020 20:59:10 EST Ventricular Rate:  112 PR Interval:  126 QRS Duration: 70 QT Interval:  358 QTC Calculation: 488 R Axis:   99 Text Interpretation: Sinus tachycardia Rightward axis Nonspecific ST abnormality Abnormal ECG Confirmed by Thayer Jew [phone]) on 08/09/2020 10:55:53 PM   Radiology DG Chest 2 View  Result Date: 08/09/2020 CLINICAL DATA:  Palpitations, chest pain, and shaking for 1 week. EXAM: CHEST - 2 VIEW COMPARISON:  01/13/2020 FINDINGS: The heart size and mediastinal contours are within  normal limits. Both lungs are clear. The visualized skeletal structures are unremarkable. IMPRESSION: No active cardiopulmonary disease. Electronically Signed   By: Oren Beckmann.D.  On: 08/09/2020 23:53    Procedures Procedures (including critical care time)  Medications Ordered in ED Medications  LORazepam (ATIVAN) tablet 1 mg (1 mg Oral Given 08/10/20 0000)  potassium chloride SA (KLOR-CON) CR tablet 40 mEq (40 mEq Oral Given 08/10/20 0125)    ED Course  I have reviewed the triage vital signs and the nursing notes.  Pertinent labs & imaging results that were available during my care of the patient were reviewed by me and considered in my medical decision making (see chart for details).    MDM Rules/Calculators/A&P                          Patient presents with ongoing palpitations and chest pain.  History of SVT.  Also daily alcohol use.  She is nontoxic.  Slightly tachycardic.  Low suspicion for PE.  Screening D-dimer sent to her stratify.  Pain is atypical and have low suspicion for ACS as she is otherwise low risk.  EKG is without acute ischemia or arrhythmia.  Initial troponin is negative.  Chest x-ray without pneumothorax or pneumonia.  She denies any infectious symptoms.  Lab work notable for mild hypokalemia with potassium of 3.0.  Will optimize and replace.  D-dimer is negative.  Recommend continuing metoprolol at home.  Alcohol cessation under medical supervision given her daily use was recommended as this is likely contributing.  Recommend daily potassium and recheck by her primary physician.  Patient stated understanding.  TSH is pending but have low suspicion for thyroid storm.  Will follow up.  6:48 AM  TSH reassuring.  After history, exam, and medical workup I feel the patient has been appropriately medically screened and is safe for discharge home. Pertinent diagnoses were discussed with the patient. Patient was given return precautions.  Final Clinical  Impression(s) / ED Diagnoses Final diagnoses:  Palpitations  Atypical chest pain  Alcohol abuse  Hypokalemia    Rx / DC Orders ED Discharge Orders         Ordered    potassium chloride 20 MEQ TBCR  2 times daily        08/10/20 0137           Juanita Devincent, Barbette Hair, MD 08/10/20 [phone]

## 2020-08-09 NOTE — ED Notes (Signed)
Patient in room resting in bed. Shaking noted. Requesting tv remote.

## 2020-08-09 NOTE — ED Triage Notes (Addendum)
Pt reports she has been "shaking" x 1 week. Tonight around 6pm she began having palpitations and chest pain. Pt trembling in triage, radial pulses present, skin warm and dry. States she has a hx of SVT and has been taking metoprolol pending an ablation

## 2020-08-10 LAB — CBC WITH DIFFERENTIAL/PLATELET
Abs Immature Granulocytes: 0.03 10*3/uL (ref 0.00–0.07)
Basophils Absolute: 0.1 10*3/uL (ref 0.0–0.1)
Basophils Relative: 1 %
Eosinophils Absolute: 0 10*3/uL (ref 0.0–0.5)
Eosinophils Relative: 0 %
HCT: 41.1 % (ref 36.0–46.0)
Hemoglobin: 14.5 g/dL (ref 12.0–15.0)
Immature Granulocytes: 0 %
Lymphocytes Relative: 19 %
Lymphs Abs: 1.5 10*3/uL (ref 0.7–4.0)
MCH: 33 pg (ref 26.0–34.0)
MCHC: 35.3 g/dL (ref 30.0–36.0)
MCV: 93.4 fL (ref 80.0–100.0)
Monocytes Absolute: 0.6 10*3/uL (ref 0.1–1.0)
Monocytes Relative: 7 %
Neutro Abs: 5.8 10*3/uL (ref 1.7–7.7)
Neutrophils Relative %: 73 %
Platelets: 184 10*3/uL (ref 150–400)
RBC: 4.4 MIL/uL (ref 3.87–5.11)
RDW: 13.4 % (ref 11.5–15.5)
WBC: 8 10*3/uL (ref 4.0–10.5)
nRBC: 0 % (ref 0.0–0.2)

## 2020-08-10 LAB — BASIC METABOLIC PANEL
Anion gap: 13 (ref 5–15)
BUN: 8 mg/dL (ref 6–20)
CO2: 25 mmol/L (ref 22–32)
Calcium: 9.4 mg/dL (ref 8.9–10.3)
Chloride: 98 mmol/L (ref 98–111)
Creatinine, Ser: 0.87 mg/dL (ref 0.44–1.00)
GFR, Estimated: >60 mL/min (ref >60)
Glucose, Bld: 99 mg/dL (ref 70–99)
Potassium: 3 mmol/L — ABNORMAL LOW (ref 3.5–5.1)
Sodium: 136 mmol/L (ref 135–145)

## 2020-08-10 LAB — ETHANOL: Alcohol, Ethyl (B): <10 mg/dL (ref <10)

## 2020-08-10 LAB — D-DIMER, QUANTITATIVE: D-Dimer, Quant: <0.27 ug/mL-FEU (ref 0.00–0.50)

## 2020-08-10 LAB — TSH: TSH: 1.803 u[IU]/mL (ref 0.350–4.500)

## 2020-08-10 LAB — TROPONIN I (HIGH SENSITIVITY): Troponin I (High Sensitivity): 2 ng/L (ref <18)

## 2020-08-10 MED ORDER — POTASSIUM CHLORIDE CRYS ER 20 MEQ PO TBCR
40.0000 meq | EXTENDED_RELEASE_TABLET | Freq: Once | ORAL | Status: AC
  Administered 2020-08-10: 40 meq via ORAL
  Filled 2020-08-10: qty 2

## 2020-08-10 MED ORDER — POTASSIUM CHLORIDE ER 20 MEQ PO TBCR
20.0000 meq | EXTENDED_RELEASE_TABLET | Freq: Two times a day (BID) | ORAL | 0 refills | Status: DC
Start: 2020-08-10 — End: 2020-08-31

## 2020-08-10 NOTE — Discharge Instructions (Addendum)
You were seen today for palpitations.  Your work-up is reassuring.  You did have slightly low potassium.  Take potassium as prescribed and follow-up with your primary doctor for recheck.  Your ongoing alcohol use is likely contributing some.  Given your daily use of alcohol, you would be at risk for possible withdrawal.  If you are interested in stopping drinking, talk to your primary doctor about how to safely discontinue alcohol use.

## 2020-08-31 ENCOUNTER — Ambulatory Visit
Admission: EM | Admit: 2020-08-31 | Discharge: 2020-08-31 | Disposition: A | Discharge status: Home / Self Care | Payer: Managed Care, Other (non HMO) | Attending: Internal Medicine | Admitting: Internal Medicine

## 2020-08-31 ENCOUNTER — Other Ambulatory Visit: Payer: Self-pay

## 2020-08-31 DIAGNOSIS — R079 Chest pain, unspecified: Secondary | ICD-10-CM

## 2020-08-31 MED ORDER — ACETAMINOPHEN 325 MG PO TABS
650.0000 mg | ORAL_TABLET | Freq: Four times a day (QID) | ORAL | Status: DC | PRN
  Administered 2020-08-31: 650 mg via ORAL

## 2020-08-31 MED ORDER — POTASSIUM CHLORIDE ER 10 MEQ PO TBCR: 10.0000 meq | EXTENDED_RELEASE_TABLET | Freq: Every day | ORAL | 0 refills | Status: AC

## 2020-08-31 NOTE — ED Triage Notes (Signed)
Patient states she has chest pain like someone sitting on her chest since noon today and sharp when she breathes in. Pt is trembling and complains of weakness. Pt is aox4 and ambulatory.

## 2020-08-31 NOTE — ED Provider Notes (Signed)
[facility]    CSN: [id number] Arrival date & time: 08/31/20  1416      History   Chief Complaint Chief Complaint  Patient presents with  . Chest Pain    Today at 1200     HPI Angela Guzman is a 30 y.o. female.   The history is provided by the patient. No language interpreter was used.  Chest Pain Pain location:  L chest Pain quality: aching and pressure   Pain radiates to:  Does not radiate Pain severity:  Severe Progression:  Worsening Chronicity:  New Relieved by:  Nothing Worsened by:  Nothing Ineffective treatments:  None tried Risk factors comment:  Svt Pt reports she has had the same in the past with svt.    Past Medical History:  Diagnosis Date  . Allergic rhinitis   . SVT (supraventricular tachycardia) ([place])   . Tobacco abuse   . UTI (lower urinary tract infection)     Patient Active Problem List   Diagnosis Date Noted  . Other normal pregnancy, not first 10/16/2014  . SVD (spontaneous vaginal delivery) 10/16/2014  . Normal pregnancy in multigravida in third trimester 10/15/2014  . Tobacco abuse   . SVT (supraventricular tachycardia) ([place]) 07/24/2012    Past Surgical History:  Procedure Laterality Date  . NO PAST SURGERIES      OB History    Gravida  2   Para  2   Term  2   Preterm  0   AB  0   Living  2     SAB  0   IAB  0   Ectopic  0   Multiple  0   Live Births  2            Home Medications    Prior to Admission medications   Medication Sig Start Date End Date Taking? Authorizing Provider  FLUoxetine (PROZAC) 10 MG capsule Take 10 mg by mouth daily. 06/09/20  Yes [provider]  FLUoxetine (PROZAC) 20 MG capsule Take 20 mg by mouth daily. 07/03/20  Yes [provider]  metoprolol succinate (TOPROL XL) 25 MG 24 hr tablet Take 1 tablet (25 mg total) by mouth daily. 02/24/20  Yes Baldwin Jamaica, PA-C  potassium chloride (KLOR-CON) 10 MEQ tablet Take 1 tablet (10 mEq total) by  mouth daily. 08/31/20  Yes Fransico Meadow, PA-C    Family History Family History  Problem Relation Age of Onset  . Hypertension Paternal Grandmother   . Diabetes Paternal Grandmother   . Alzheimer's disease Paternal Grandmother   . Heart Problems Paternal Aunt        SVT  . Cancer Paternal Aunt        skin  . Heart Problems Maternal Grandfather        SVT  . Alcohol abuse Mother   . Alcohol abuse Father   . Throat cancer Paternal Grandfather        pipe cancer    Social History Social History   Tobacco Use  . Smoking status: Former Smoker    Packs/day: 0.50    Types: Cigarettes    Quit date: 01/15/2014    Years since quitting: 6.6  . Smokeless tobacco: Never Used  Vaping Use  . Vaping Use: Never used  Substance Use Topics  . Alcohol use: Yes    Comment: occasionally  . Drug use: No     Allergies   Patient has no known allergies.   Review  of Systems Review of Systems  Cardiovascular: Positive for chest pain.  All other systems reviewed and are negative.    Physical Exam Triage Vital Signs ED Triage Vitals  Enc Vitals Group     BP 08/31/20 1430 126/87     Pulse Rate 08/31/20 1430 80     Resp 08/31/20 1430 20     Temp 08/31/20 1430 98.3 F (36.8 C)     Temp Source 08/31/20 1430 Oral     SpO2 08/31/20 1430 98 %     Weight --      Height --      Head Circumference --      Peak Flow --      Pain Score 08/31/20 1431 8     Pain Loc --      Pain Edu? --      Excl. in [place]? --    No data found.  Updated Vital Signs BP 126/87 (BP Location: Left Arm)   Pulse 80   Temp 98.3 F (36.8 C) (Oral)   Resp 20   SpO2 98%   Visual Acuity Right Eye Distance:   Left Eye Distance:   Bilateral Distance:    Right Eye Near:   Left Eye Near:    Bilateral Near:     Physical Exam Vitals and nursing note reviewed.  Constitutional:      Appearance: She is well-developed and well-nourished.  HENT:     Head: Normocephalic.  Eyes:     Extraocular Movements:  EOM normal.  Cardiovascular:     Rate and Rhythm: Normal rate and regular rhythm.     Heart sounds: Normal heart sounds.  Pulmonary:     Effort: Pulmonary effort is normal.     Breath sounds: Normal breath sounds.  Abdominal:     General: Bowel sounds are normal. There is no distension.     Palpations: Abdomen is soft.  Musculoskeletal:        General: Normal range of motion.     Cervical back: Normal range of motion.  Skin:    General: Skin is warm.  Neurological:     General: No focal deficit present.     Mental Status: She is alert and oriented to person, place, and time.  Psychiatric:        Mood and Affect: Mood and affect normal.      UC Treatments / Results  Labs (all labs ordered are listed, but only abnormal results are displayed) Labs Reviewed - No data to display  EKG   Radiology No results found.  Procedures Procedures (including critical care time)  Medications Ordered in UC Medications  acetaminophen (TYLENOL) tablet 650 mg (650 mg Oral Given 08/31/20 1504)    Initial Impression / Assessment and Plan / UC Course  I have reviewed the triage vital signs and the nursing notes.  Pertinent labs & imaging results that were available during my care of the patient were reviewed by me and considered in my medical decision making (see chart for details).     MDM:  EKG normal sinus normal ekg.  Pt given tylenol for headache.  Pt is shakey.  Pt admits to etoh.  I advised pt I thought she should go to hospital for further evaluation.  Pt refused, Pt adgrees to go if worse.  She will call Dr. Caryl Comes to schedule evaluation Final Clinical Impressions(s) / UC Diagnoses   Final diagnoses:  Chest pain, unspecified type     Discharge Instructions  Go to the Emergency Department for evaluation.  Schedule to see Dr. Caryl Comes     ED Prescriptions    Medication Sig Dispense Auth. Provider   potassium chloride (KLOR-CON) 10 MEQ tablet Take 1 tablet (10 mEq total)  by mouth daily. 30 tablet Fransico Meadow, [place]     PDMP not reviewed this encounter.  An After Visit Summary was printed and given to the patient.    Fransico Meadow, [place] 08/31/20 1612

## 2020-08-31 NOTE — Discharge Instructions (Addendum)
Go to the Emergency Department for evaluation.  Schedule to see Dr. Caryl Comes

## 2020-08-31 NOTE — ED Notes (Signed)
Pt was recommended to go to the ED patient states that she will go to the ED if she feels worse and was reminded to f/u with cardiologist.

## 2021-02-12 ENCOUNTER — Telehealth: Payer: Self-pay

## 2021-02-12 MED ORDER — METOPROLOL SUCCINATE ER 25 MG PO TB24: 25.0000 mg | ORAL_TABLET | Freq: Every day | ORAL | 2 refills | Status: DC

## 2021-02-12 NOTE — Telephone Encounter (Signed)
RECEIVED A CALL FROM THE PATIENT REQUESTING A REFILL ON METOPROLOL.   REVIEWED CHART AND SHE NEEDED A FOLLOW UP APPOINTMENT.   PATIENT AGREEABLE AND REQUESTED A VIRTUAL VISIT DU TO HER NOT HAVING MEDICAL INSURANCE. FOLLOW UP SCHEDULED FOR November WITH DR Caryl Comes.   PATIENT VOICED UNDERSTANDING.   Rx(s) sent to pharmacy electronically.

## 2021-06-14 NOTE — Progress Notes (Incomplete)
Electrophysiology TeleHealth Note   Due to national recommendations of social distancing due to COVID 19, an audio/video telehealth visit is felt to be most appropriate for this patient at this time.  See MyChart message from today for the patient's consent to telehealth for [facility].   Date:  06/14/2021   ID:  Angela Guzman, DOB 07-22-1990, MRN [id number]  Location: patient's home  Provider location: [address] [place]  Evaluation Performed: Follow-up visit  PCP:  Caren Macadam, MD  Cardiologist:    Electrophysiologist:  SK   Chief Complaint: SVT  History of Present Illness:    Angela Guzman is a 30 y.o. female who presents via audio/video conferencing for a telehealth visit today.  Since last being seen in our clinic for SVT w 2 distinct clinical syndromes, one likley reentrant the other sounding like dysautonomia  the patient reported recurrent daily palps and then infrequent SVT inclu 6/21 prompting call to EMS See Below  Sx of CP SOB and Presyncope  Recorded BP with HR 160 was 140/70s  No clear triggers   Reluctant to undergo procedure (has two small kids)  The patient denies symptoms of fevers, chills, cough, or new SOB worrisome for COVID 19.    Past Medical History:  Diagnosis Date   Allergic rhinitis    SVT (supraventricular tachycardia) ([facility])    Tobacco abuse    UTI (lower urinary tract infection)     Past Surgical History:  Procedure Laterality Date   NO PAST SURGERIES      Current Outpatient Medications  Medication Sig Dispense Refill   FLUoxetine (PROZAC) 10 MG capsule Take 10 mg by mouth daily.     FLUoxetine (PROZAC) 20 MG capsule Take 20 mg by mouth daily.     metoprolol succinate (TOPROL XL) 25 MG 24 hr tablet Take 1 tablet (25 mg total) by mouth daily. 90 tablet 2   potassium chloride (KLOR-CON) 10 MEQ tablet Take 1 tablet (10 mEq total) by mouth daily. 30 tablet 0   No current facility-administered  medications for this visit.    Allergies:   Patient has no known allergies.   Social History:  The patient  reports that she quit smoking about 7 years ago. Her smoking use included cigarettes. She smoked an average of .5 packs per day. She has never used smokeless tobacco. She reports current alcohol use. She reports that she does not use drugs.   Family History:  The patient's   family history includes Alcohol abuse in her father and mother; Alzheimer's disease in her paternal grandmother; Cancer in her paternal aunt; Diabetes in her paternal grandmother; Heart Problems in her maternal grandfather and paternal aunt; Hypertension in her paternal grandmother; Throat cancer in her paternal grandfather.   ROS:  Please see the history of present illness.   All other systems are personally reviewed and negative.    Exam:    Vital Signs:  There were no vitals taken for this visit.    Well appearing, alert and conversant, regular work of breathing,  good skin color Eyes- anicteric, neuro- grossly intact, skin- no apparent rash or lesions or cyanosis, mouth- oral mucosa is pink   Labs/Other Tests and Data Reviewed:    Recent Labs: 08/10/2020: BUN 8; Creatinine, Ser 0.87; Hemoglobin 14.5; Platelets 184; Potassium 3.0; Sodium 136; TSH 1.803   Wt Readings from Last 3 Encounters:  08/09/20 160 lb (72.6 kg)  07/18/20 160 lb (72.6 kg)  01/16/20 163 lb (73.9 kg)     Other studies personally reviewed: Additional studies/ records that were reviewed today include:EMS record 6/21>> SVT CL 250 msec or so    ASSESSMENT & PLAN:    SVT -adenosine responsive  TAchyPalps ? dysautonomia   Recurrent symptomatic SVT with rapdi CL and presyncope.  She is reluctant to proceed with RFCA at this juncture so we have discussed medical therapy to be taken on a daily basis  She has an issue with pill size so will give her a list to take to pharmacy to rank by size Verapamil 120 cd dilt 120 CD Metop succ  25 Atenolol 25 Met tart 25 bid Bisoprolol 2.5 nebivilol 2.5   She will call us back following her survey and will Rx   COVID 19 screen The patient denies symptoms of COVID 19 at this time.  The importance of social distancing was discussed today.  Follow-up:  61mOV or telehealth visit    Current medicines are reviewed at length with the patient today.   The patient does not have concerns regarding her medicines.  The following changes were made today:  As above   Labs/ tests ordered today include:   No orders of the defined types were placed in this encounter.      Patient Risk:  after full review of this patients clinical status, I feel that they are at moderate  risk at this time.  Today, I have spent ***9 minutes with the patient with telehealth technology discussing the above.  I,Mykaella Javier,acting as a scribe for SVirl Axe MD.,have documented all relevant documentation on the behalf of SVirl Axe MD,as directed by  SVirl Axe MD while in the presence of SVirl Axe MD.  ***  Signed, MOrion Crook 06/14/2021 2:06 PM     [facility] [phone](office) ([phone](fax)

## 2021-06-15 ENCOUNTER — Other Ambulatory Visit: Payer: Self-pay

## 2021-06-15 ENCOUNTER — Telehealth: Payer: Managed Care, Other (non HMO) | Admitting: Internal Medicine

## 2021-06-15 DIAGNOSIS — I471 Supraventricular tachycardia: Secondary | ICD-10-CM

## 2021-09-07 ENCOUNTER — Ambulatory Visit: Payer: Self-pay | Admitting: Internal Medicine

## 2021-10-21 ENCOUNTER — Telehealth: Payer: Self-pay | Admitting: Internal Medicine

## 2021-10-21 NOTE — Telephone Encounter (Signed)
Pt c/o medication issue: ? ?1. Name of Medication: Metoprolol ? ?2. How are you currently taking this medication (dosage and times per day)?  1 time a day ? ?3. Are you having a reaction (difficulty breathing--STAT)?  yes ? ?4. What is your medication issue? Dizziness, short of breath right after she takes the Metoprolol- she said this happen to her years ago when she took Metoprolol ? ?

## 2021-10-21 NOTE — Telephone Encounter (Signed)
Attempted phone call to pt and left voicemail message to contact RN at [phone]. 

## 2021-11-15 ENCOUNTER — Telehealth: Payer: Self-pay | Admitting: Internal Medicine

## 2021-11-15 MED ORDER — METOPROLOL SUCCINATE ER 25 MG PO TB24
25.0000 mg | ORAL_TABLET | Freq: Every day | ORAL | 0 refills | Status: DC
Start: 2021-11-15 — End: 2021-11-25

## 2021-11-15 NOTE — Telephone Encounter (Signed)
?*  STAT* If patient is at the pharmacy, call can be transferred to refill team. ? ? ?1. Which medications need to be refilled? (please list name of each medication and dose if known) metoprolol succinate (TOPROL XL) 25 MG 24 hr tablet ? ?2. Which pharmacy/location (including street and city if local pharmacy) is medication to be sent to? [facility] [place] ? ?3. Do they need a 30 day or 90 day supply? 90 day ? ?Patient has an appointment 11/25/21 ?

## 2021-11-15 NOTE — Telephone Encounter (Signed)
Pt's medication was sent to pt's pharmacy as requested. Confirmation received.  °

## 2021-11-18 NOTE — Progress Notes (Signed)
? ?PCP:  Deboraha Sprang, MD ?Primary Cardiologist: None ?Electrophysiologist: Virl Axe, MD  ? ?Angela Guzman is a 31 y.o. female seen today for Virl Axe, MD for routine electrophysiology followup.  Since last being seen in our clinic the patient reports doing about the same. She has palpitations 2-3 times a week. Described as a sensation of imminent tachycardia that resolves within seconds.  she denies chest pain, dyspnea, PND, orthopnea, nausea, vomiting, dizziness, syncope, edema, weight gain, or early satiety. ? ?Past Medical History:  ?Diagnosis Date  ? Allergic rhinitis   ? SVT (supraventricular tachycardia) ([place])   ? Tobacco abuse   ? UTI (lower urinary tract infection)   ? ?Past Surgical History:  ?Procedure Laterality Date  ? NO PAST SURGERIES    ? ? ?Current Outpatient Medications  ?Medication Sig Dispense Refill  ? FLUoxetine (PROZAC) 10 MG capsule Take 10 mg by mouth daily.    ? FLUoxetine (PROZAC) 20 MG capsule Take 20 mg by mouth daily.    ? metoprolol succinate (TOPROL XL) 25 MG 24 hr tablet Take 1 tablet (25 mg total) by mouth daily. 30 tablet 0  ? potassium chloride (KLOR-CON) 10 MEQ tablet Take 1 tablet (10 mEq total) by mouth daily. 30 tablet 0  ? ?No current facility-administered medications for this visit.  ? ? ?No Known Allergies ? ?Social History  ? ?Socioeconomic History  ? Marital status: Single  ?  Spouse name: Not on file  ? Number of children: 1  ? Years of education: Not on file  ? Highest education level: Not on file  ?Occupational History  ?  Employer: [employer]  ?Tobacco Use  ? Smoking status: Former  ?  Packs/day: 0.50  ?  Types: Cigarettes  ?  Quit date: 01/15/2014  ?  Years since quitting: 7.8  ? Smokeless tobacco: Never  ?Vaping Use  ? Vaping Use: Never used  ?Substance and Sexual Activity  ? Alcohol use: Yes  ?  Comment: occasionally  ? Drug use: No  ? Sexual activity: Yes  ?Other Topics Concern  ? Not on file  ?Social History Narrative  ? Not on file  ? ?Social  Determinants of Health  ? ?Financial Resource Strain: Not on file  ?Food Insecurity: Not on file  ?Transportation Needs: Not on file  ?Physical Activity: Not on file  ?Stress: Not on file  ?Social Connections: Not on file  ?Intimate Partner Violence: Not on file  ? ? ? ?Review of Systems: ?All other systems reviewed and are otherwise negative except as noted above. ? ?Physical Exam: ?Vitals:  ? 11/25/21 0927  ?BP: 110/80  ?Pulse: 83  ?SpO2: 98%  ?Weight: 145 lb (65.8 kg)  ?Height: '5\' 7"'$  (1.702 m)  ? ? ?GEN- The patient is well appearing, alert and oriented x 3 today.   ?HEENT: normocephalic, atraumatic; sclera clear, conjunctiva pink; hearing intact; oropharynx clear; neck supple, no JVP ?Lymph- no cervical lymphadenopathy ?Lungs- Clear to ausculation bilaterally, normal work of breathing.  No wheezes, rales, rhonchi ?Heart- Regular rate and rhythm, no murmurs, rubs or gallops, PMI not laterally displaced ?GI- soft, non-tender, non-distended, bowel sounds present, no hepatosplenomegaly ?Extremities- no clubbing, cyanosis, or edema; DP/PT/radial pulses 2+ bilaterally ?MS- no significant deformity or atrophy ?Skin- warm and dry, no rash or lesion ?Psych- euthymic mood, full affect ?Neuro- strength and sensation are intact ? ?EKG is ordered. Personal review of EKG from today shows NSR at 83 ? ?Additional studies reviewed include: ?Previous EP office  notes.  ? ?Assessment and Plan: ? ?Tachycardia by history-adenosine sensitive ?2. Tachycardia, flushing, shortness of breath separate from the above ? ?Previously recommended visit with Dr. Lovena Le to discuss ablation. She is currently without insurance, but would eventually be interested. ?She wishes for better control. Will move metoprolol to bedtime and increase to 37.5 mg. She would like to try this middle dose prior to increasing to 50 mg.  ? ?3. Hypokalemia ?Labs today. She has been taking OTC. Discussed this is likely insufficient  given her degree of hypokalemia in  the past.  ? ?Follow up with Dr. Caryl Comes in 12 months per patient. Sooner with worsening burden.   ? ?Shirley Friar, PA-C  ?11/25/21 ?9:39 AM  ?

## 2021-11-25 ENCOUNTER — Encounter: Payer: Self-pay | Admitting: Student

## 2021-11-25 ENCOUNTER — Ambulatory Visit (INDEPENDENT_AMBULATORY_CARE_PROVIDER_SITE_OTHER): Payer: Self-pay | Admitting: Student

## 2021-11-25 VITALS — BP 110/80 | HR 83 | Ht 67.0 in | Wt 145.0 lb

## 2021-11-25 DIAGNOSIS — I471 Supraventricular tachycardia: Secondary | ICD-10-CM

## 2021-11-25 LAB — BASIC METABOLIC PANEL
BUN/Creatinine Ratio: 10 (ref 9–23)
BUN: 7 mg/dL (ref 6–20)
CO2: 26 mmol/L (ref 20–29)
Calcium: 9.1 mg/dL (ref 8.7–10.2)
Chloride: 100 mmol/L (ref 96–106)
Creatinine, Ser: 0.69 mg/dL (ref 0.57–1.00)
Glucose: 85 mg/dL (ref 70–99)
Potassium: 4.2 mmol/L (ref 3.5–5.2)
Sodium: 142 mmol/L (ref 134–144)
eGFR: 120 mL/min/{1.73_m2} (ref >59)

## 2021-11-25 MED ORDER — METOPROLOL SUCCINATE ER 25 MG PO TB24: 37.5000 mg | ORAL_TABLET | Freq: Every day | ORAL | 3 refills | Status: AC

## 2021-11-25 NOTE — Patient Instructions (Signed)
Medication Instructions:  ?Your physician has recommended you make the following change in your medication:  ? ?INCREASE: Metoprolol to 37.'5mg'$  daily at bedtime ? ?*If you need a refill on your cardiac medications before your next appointment, please call your pharmacy* ? ? ?Lab Work: ?TODAY: BMET ? ?If you have labs (blood work) drawn today and your tests are completely normal, you will receive your results only by: ?MyChart Message (if you have MyChart) OR ?A paper copy in the mail ?If you have any lab test that is abnormal or we need to change your treatment, we will call you to review the results. ? ? ?Follow-Up: ?At [facility], you and your health needs are our priority.  As part of our continuing mission to provide you with exceptional heart care, we have created designated Provider Care Teams.  These Care Teams include your primary Cardiologist (physician) and Advanced Practice Providers (APPs -  Physician Assistants and Nurse Practitioners) who all work together to provide you with the care you need, when you need it. ? ? ?Your next appointment:   ?1 year(s) ? ?The format for your next appointment:   ?In Person ? ?Provider:   ? Virl Axe, MD  ?

## 2021-12-02 NOTE — Telephone Encounter (Addendum)
Attempted phone call to pt.  Unable to leave voicemail message as this has not been set up.  This is our third contact attempt.  My Chart message has not been read by pt.  Pt was seen by Oda Kilts, PA-C on 11/25/2021.

## 2022-05-02 ENCOUNTER — Telehealth: Payer: Self-pay | Admitting: Internal Medicine

## 2022-05-02 NOTE — Telephone Encounter (Signed)
Patient states she is not having an SVT attack but her heart wakes her up every night racing. States she is having SOB daily random in nature and nausea is occurring 2-3 times a month. She is only taking metoprolol succinate 25 mg daily and bedtime and not the 37.5 mg prescribed, she never increased the mediation. She has not taken her Prozac in over a year because she doesn't have insurance and never started the potassium. Will update mediation list. Patient stated to help her heart rate at night she has to sit up in bed for about 15 minutes and it subsides. Appt made this morning for 12/26 with Dr. Caryl Comes.  Daily she describes a dull pain in her chest that can last 5 -60 minutes. No pain at this time. Gave ED precautions.  Verbalized understanding and agreement.   Will forward to MD for advisement.

## 2022-05-02 NOTE — Telephone Encounter (Signed)
Attempted phone call to pt.  Unable to leave voicemail as voicemail has not been set up.

## 2022-05-02 NOTE — Telephone Encounter (Signed)
Pt c/o of Chest Pain: STAT if CP now or developed within 24 hours  1. Are you having CP right now? This morning  2. Are you experiencing any other symptoms (ex. SOB, nausea, vomiting, sweating)? SOB, nausea  3. How long have you been experiencing CP? A year   4. Is your CP continuous or coming and going? Both   5. Have you taken Nitroglycerin? No  ?

## 2022-05-06 NOTE — Telephone Encounter (Signed)
Attempted phone call to pt.  Unable to leave voicemail message as voicemail has not been set up.

## 2022-05-10 NOTE — Telephone Encounter (Signed)
Attempted phone call to pt.  Unable to leave voicemail message as voicemail has not been set up.

## 2022-06-01 ENCOUNTER — Emergency Department (HOSPITAL_COMMUNITY): Payer: Medicaid Other

## 2022-06-01 ENCOUNTER — Other Ambulatory Visit: Payer: Self-pay

## 2022-06-01 ENCOUNTER — Emergency Department (HOSPITAL_COMMUNITY)
Admission: EM | Admit: 2022-06-01 | Discharge: 2022-06-01 | Disposition: A | Discharge status: Home / Self Care | Payer: Medicaid Other | Attending: Emergency Medicine | Admitting: Emergency Medicine

## 2022-06-01 ENCOUNTER — Encounter (HOSPITAL_COMMUNITY): Payer: Self-pay

## 2022-06-01 DIAGNOSIS — R202 Paresthesia of skin: Secondary | ICD-10-CM | POA: Diagnosis present

## 2022-06-01 DIAGNOSIS — F1020 Alcohol dependence, uncomplicated: Secondary | ICD-10-CM | POA: Diagnosis not present

## 2022-06-01 LAB — COMPREHENSIVE METABOLIC PANEL
ALT: 33 U/L (ref 0–44)
AST: 85 U/L — ABNORMAL HIGH (ref 15–41)
Albumin: 3.9 g/dL (ref 3.5–5.0)
Alkaline Phosphatase: 59 U/L (ref 38–126)
Anion gap: 11 (ref 5–15)
BUN: 7 mg/dL (ref 6–20)
CO2: 24 mmol/L (ref 22–32)
Calcium: 9.1 mg/dL (ref 8.9–10.3)
Chloride: 103 mmol/L (ref 98–111)
Creatinine, Ser: 0.73 mg/dL (ref 0.44–1.00)
GFR, Estimated: >60 mL/min (ref >60)
Glucose, Bld: 103 mg/dL — ABNORMAL HIGH (ref 70–99)
Potassium: 3.4 mmol/L — ABNORMAL LOW (ref 3.5–5.1)
Sodium: 138 mmol/L (ref 135–145)
Total Bilirubin: 0.6 mg/dL (ref 0.3–1.2)
Total Protein: 7.5 g/dL (ref 6.5–8.1)

## 2022-06-01 LAB — CBC
HCT: 37.5 % (ref 36.0–46.0)
Hemoglobin: 12.6 g/dL (ref 12.0–15.0)
MCH: 27.5 pg (ref 26.0–34.0)
MCHC: 33.6 g/dL (ref 30.0–36.0)
MCV: 81.7 fL (ref 80.0–100.0)
Platelets: 198 10*3/uL (ref 150–400)
RBC: 4.59 MIL/uL (ref 3.87–5.11)
RDW: 15.9 % — ABNORMAL HIGH (ref 11.5–15.5)
WBC: 3 10*3/uL — ABNORMAL LOW (ref 4.0–10.5)
nRBC: 0 % (ref 0.0–0.2)

## 2022-06-01 LAB — I-STAT BETA HCG BLOOD, ED (MC, WL, AP ONLY): I-stat hCG, quantitative: <5 m[IU]/mL (ref <5)

## 2022-06-01 LAB — ETHANOL: Alcohol, Ethyl (B): 76 mg/dL — ABNORMAL HIGH (ref <10)

## 2022-06-01 MED ORDER — CHLORDIAZEPOXIDE HCL 25 MG PO CAPS: ORAL_CAPSULE | ORAL | 0 refills | Status: AC

## 2022-06-01 MED ORDER — LORAZEPAM 2 MG/ML IJ SOLN
1.0000 mg | Freq: Once | INTRAMUSCULAR | Status: AC
Start: 2022-06-01 — End: 2022-06-01
  Administered 2022-06-01: 1 mg via INTRAMUSCULAR
  Filled 2022-06-01: qty 1

## 2022-06-01 MED ORDER — CENTRUM PO CHEW: 1.0000 | CHEWABLE_TABLET | Freq: Every day | ORAL | 1 refills | Status: AC

## 2022-06-01 NOTE — ED Provider Triage Note (Signed)
Emergency Medicine Provider Triage Evaluation Note  Angela Guzman , a 31 y.o. female  was evaluated in triage.  Pt complains of numbness.  Patient reports about 2 weeks ago she noted that she felt numb in both lower extremities.  This is now resolved.  She then noted yesterday that she started to have numbness in the tips of her fingers and has been spreading up her arm left arm.  She also endorses a headache with sensation of numbness in her face.  Denies any loss of bowel or bladder control, no fevers, no chills, denies any IV drug use.  Does endorse daily vodka use though cannot quantify how much.  Reports that she drinks to calm her anxiety.  Nuys any history of back pain or injuries.  Review of Systems  Positive: As above Negative: As above  Physical Exam  BP (!) 148/101 (BP Location: Right Arm)   Pulse (!) 112   Temp 97.7 F (36.5 C)   Resp 18   SpO2 98%  Gen:   Awake, no distress, anxious appearing Resp:  Normal effort  MSK:   Moves extremities without difficulty  Other:  5/5 strength in upper lower extremities bilaterally, though upper extremities tremulous.  Sensation grossly intact.  No cervical, thoracic or lumbar spine tenderness.  Medical Decision Making  Medically screening exam initiated at 4:51 AM.  Appropriate orders placed.  Angela Guzman was informed that the remainder of the evaluation will be completed by another provider, this initial triage assessment does not replace that evaluation, and the importance of remaining in the ED until their evaluation is complete.     Garald Balding, PA-C 06/01/22 [phone]

## 2022-06-01 NOTE — ED Notes (Signed)
Patient Alert and oriented to baseline. Stable and ambulatory to baseline. Patient verbalized understanding of the discharge instructions.  Patient belongings were taken by the patient.   

## 2022-06-01 NOTE — ED Provider Notes (Signed)
[facility] EMERGENCY DEPARTMENT Provider Note   CSN: [id number] Arrival date & time: 06/01/22  0433     History  Chief Complaint  Patient presents with   Numbness    Angela Guzman is a 31 y.o. female.  31 year old female with prior medical history as detailed below presents for evaluation.  Patient with longstanding history of SVT, anxiety, and difficult to quantify but likely heavy daily alcohol use.  Patient reports that she drinks multiple shots of vodka daily to help with her anxiety.  Patient reports initiation of BuSpar for anxiety approximately 3 days ago.  Patient reports that she is started at 10 mg twice a day.  Secondary to her tingling paresthesias, her prescribing provider suggested yesterday that she cut back to 5 mg twice a day.  She reports that she has had persistent tingling of her fingertips and toes for the last week.  The tingling numbness will occasionally spread proximally.  She denies loss of consciousness or seizure-like episodes.  She denies chest pain or shortness of breath.      The history is provided by the patient and medical records.       Home Medications Prior to Admission medications   Medication Sig Start Date End Date Taking? Authorizing Provider  FLUoxetine (PROZAC) 10 MG capsule Take 10 mg by mouth daily. Patient not taking: Reported on 05/02/2022 06/09/20   [provider]  FLUoxetine (PROZAC) 20 MG capsule Take 20 mg by mouth daily. Patient not taking: Reported on 05/02/2022 07/03/20   [provider]  metoprolol succinate (TOPROL XL) 25 MG 24 hr tablet Take 1.5 tablets (37.5 mg total) by mouth at bedtime. Patient taking differently: Take 25 mg by mouth at bedtime. 11/25/21   Shirley Friar, PA-C  potassium chloride (KLOR-CON) 10 MEQ tablet Take 1 tablet (10 mEq total) by mouth daily. Patient not taking: Reported on 05/02/2022 08/31/20   Fransico Meadow, PA-C      Allergies     Patient has no known allergies.    Review of Systems   Review of Systems  All other systems reviewed and are negative.   Physical Exam Updated Vital Signs BP 125/87   Pulse 93   Temp 97.7 F (36.5 C)   Resp (!) 24   Ht '5\' 7"'$  (1.702 m)   Wt 65.8 kg   SpO2 99%   BMI 22.71 kg/m  Physical Exam Vitals and nursing note reviewed.  Constitutional:      General: She is not in acute distress.    Appearance: Normal appearance. She is well-developed.  HENT:     Head: Normocephalic and atraumatic.  Eyes:     Conjunctiva/sclera: Conjunctivae normal.     Pupils: Pupils are equal, round, and reactive to light.  Cardiovascular:     Rate and Rhythm: Regular rhythm. Tachycardia present.     Heart sounds: Normal heart sounds.  Pulmonary:     Effort: Pulmonary effort is normal. No respiratory distress.     Breath sounds: Normal breath sounds.  Abdominal:     General: There is no distension.     Palpations: Abdomen is soft.     Tenderness: There is no abdominal tenderness.  Musculoskeletal:        General: No deformity. Normal range of motion.     Cervical back: Normal range of motion and neck supple.  Skin:    General: Skin is warm and dry.  Neurological:     General: No focal  deficit present.     Mental Status: She is alert and oriented to person, place, and time. Mental status is at baseline.     ED Results / Procedures / Treatments   Labs (all labs ordered are listed, but only abnormal results are displayed) Labs Reviewed  CBC - Abnormal; Notable for the following components:      Result Value   WBC 3.0 (*)    RDW 15.9 (*)    All other components within normal limits  COMPREHENSIVE METABOLIC PANEL - Abnormal; Notable for the following components:   Potassium 3.4 (*)    Glucose, Bld 103 (*)    AST 85 (*)    All other components within normal limits  ETHANOL - Abnormal; Notable for the following components:   Alcohol, Ethyl (B) 76 (*)    All other components within  normal limits  RAPID URINE DRUG SCREEN, HOSP PERFORMED  I-STAT BETA HCG BLOOD, ED ([facility], [facility], [facility] ONLY)    EKG EKG Interpretation  Date/Time:  Wednesday June 01 2022 05:04:15 EST Ventricular Rate:  111 PR Interval:  160 QRS Duration: 76 QT Interval:  342 QTC Calculation: 465 R Axis:   99 Text Interpretation: Sinus tachycardia Rightward axis Borderline ECG When compared with ECG of 09-Aug-2020 20:59, PREVIOUS ECG IS PRESENT Confirmed by Dene Gentry [phone]) on 06/01/2022 7:57:46 AM  Radiology CT Head Wo Contrast  Result Date: 06/01/2022 CLINICAL DATA:  Numbness in face and bilateral hands EXAM: CT HEAD WITHOUT CONTRAST TECHNIQUE: Contiguous axial images were obtained from the base of the skull through the vertex without intravenous contrast. RADIATION DOSE REDUCTION: This exam was performed according to the departmental dose-optimization program which includes automated exposure control, adjustment of the mA and/or kV according to patient size and/or use of iterative reconstruction technique. COMPARISON:  CT Head 12/26/17 FINDINGS: Brain: No evidence of acute infarction, hemorrhage, hydrocephalus, extra-axial collection or mass lesion/mass effect. Vascular: No hyperdense vessel or unexpected calcification. Skull: There are degenerative changes of the left temporomandibular joint. Sinuses/Orbits: No acute finding. Other: None. IMPRESSION: No acute intracranial abnormality Electronically Signed   By: Marin Roberts M.D.   On: 06/01/2022 07:03    Procedures Procedures    Medications Ordered in ED Medications  LORazepam (ATIVAN) injection 1 mg (1 mg Intramuscular Given 06/01/22 0827)    ED Course/ Medical Decision Making/ A&P                           Medical Decision Making Risk Prescription drug management.    Medical Screen Complete  This patient presented to the ED with complaint of paresthesias, shaking.  This complaint involves an extensive number of treatment options.  The initial differential diagnosis includes, but is not limited to, early alcohol withdrawal, nutritional deficiency, metabolic abnormality, etc.  This presentation is: Acute, Chronic, Self-Limited, Previously Undiagnosed, Uncertain Prognosis, Complicated, Systemic Symptoms, and Threat to Life/Bodily Function  Patient with longstanding history of anxiety that she has self treated with significant daily consumption of alcohol presents with complaint of numbness and tingling.  Described paresthesias are probably most consistent with hyperventilation syndrome.  However, patient with significant heavy daily alcohol consumption.  Possibility of nutritional deficiency is not ruled out.  Patient with possible early withdrawal symptoms on initial evaluation.  With administration of Ativan here in the ED she feels significantly better.  She is interested in sobriety.  She is willing to trial Librium taper.  Patient is already reduced her BuSpar  dosing to 5 mg twice daily.  Patient is advised to contact her regular PCP later today to discuss concurrent use of both BuSpar and Librium taper.  Patient is advised repeatedly that drinking alcohol while consuming BuSpar and Librium is extremely dangerous and should not be done.  Patient provided with outpatient resources regarding alcohol addiction treatment.  Importance of close follow-up is repeatedly stressed.  Strict return precautions given and understood.  Patient is comfortable, oriented, fully alert at time of discharge. Additional history obtained:  External records from outside sources obtained and reviewed including prior ED visits and prior Inpatient records.    Lab Tests:  I ordered and personally interpreted labs.  The pertinent results include: CBC, CMP, EtOH, hCG   Imaging Studies ordered:  I ordered imaging studies including CT head I independently visualized and interpreted obtained imaging which showed NAD I agree with the radiologist  interpretation.   Cardiac Monitoring:  The patient was maintained on a cardiac monitor.  I personally viewed and interpreted the cardiac monitor which showed an underlying rhythm of: Sinus tach   Medicines ordered:  I ordered medication including Ativan for suspected early EtOH withdrawal Reevaluation of the patient after these medicines showed that the patient: improved    Problem List / ED Course:  Suspected early EtOH withdrawal, paresthesias   Reevaluation:  After the interventions noted above, I reevaluated the patient and found that they have: improved  Disposition:  After consideration of the diagnostic results and the patients response to treatment, I feel that the patent would benefit from close outpatient follow-up.          Final Clinical Impression(s) / ED Diagnoses Final diagnoses:  Paresthesia  Uncomplicated alcohol dependence ([place])    Rx / DC Orders ED Discharge Orders          Ordered    chlordiazePOXIDE (LIBRIUM) 25 MG capsule        06/01/22 0949    multivitamin-iron-minerals-folic acid (CENTRUM) chewable tablet  Daily        06/01/22 0949              Valarie Merino, MD 06/01/22 [phone]

## 2022-06-01 NOTE — Discharge Instructions (Addendum)
   Return for any problem.    Contact your regular care provider today.  Discuss the continued use of BuSpar versus the use of Librium taper.  Your tingling and numbness may be related to your chronic alcohol consumption.  If you would like to not continue to drink alcohol you may try a Librium taper.  Do not drink alcohol when taking the Librium.  Librium should help control shaking and tremors and tingling that you have experienced if you stop drinking alcohol " cold [place]."  Some of your described symptoms may be related to vitamin deficiency.  Taking a multivitamin with folic acid daily should help replace possible vitamin deficiencies related to your chronic alcohol consumption.

## 2022-06-01 NOTE — ED Triage Notes (Signed)
Reports numbness x 2 weeks in bilateral  lower extremities.   Last night noticed numbness in head all over, and left arm.   NIH-0. Denies iv drug use. Drinks some alcohol everyday to help with anxiety.

## 2022-06-08 ENCOUNTER — Ambulatory Visit: Payer: Self-pay | Admitting: Internal Medicine

## 2022-07-12 ENCOUNTER — Ambulatory Visit: Payer: Self-pay | Admitting: Internal Medicine

## 2022-08-20 ENCOUNTER — Other Ambulatory Visit: Payer: Self-pay

## 2022-08-20 ENCOUNTER — Emergency Department (HOSPITAL_BASED_OUTPATIENT_CLINIC_OR_DEPARTMENT_OTHER)
Admission: EM | Admit: 2022-08-20 | Discharge: 2022-08-20 | Disposition: A | Discharge status: Home / Self Care | Payer: Medicaid Other | Attending: Emergency Medicine | Admitting: Emergency Medicine

## 2022-08-20 ENCOUNTER — Encounter (HOSPITAL_BASED_OUTPATIENT_CLINIC_OR_DEPARTMENT_OTHER): Payer: Self-pay

## 2022-08-20 DIAGNOSIS — H9202 Otalgia, left ear: Secondary | ICD-10-CM | POA: Diagnosis present

## 2022-08-20 MED ORDER — HYDROCODONE-ACETAMINOPHEN 5-325 MG PO TABS: 1.0000 | ORAL_TABLET | Freq: Once | ORAL | Status: DC

## 2022-08-20 MED ORDER — AMOXICILLIN 500 MG PO CAPS: 1000.0000 mg | ORAL_CAPSULE | Freq: Two times a day (BID) | ORAL | 0 refills | Status: AC

## 2022-08-20 MED ORDER — AMOXICILLIN 500 MG PO CAPS
500.0000 mg | ORAL_CAPSULE | Freq: Once | ORAL | Status: AC
  Administered 2022-08-20: 500 mg via ORAL
  Filled 2022-08-20: qty 1

## 2022-08-20 MED ORDER — IBUPROFEN 400 MG PO TABS
400.0000 mg | ORAL_TABLET | Freq: Once | ORAL | Status: AC
  Administered 2022-08-20: 400 mg via ORAL
  Filled 2022-08-20: qty 1

## 2022-08-20 NOTE — ED Provider Notes (Signed)
[place] EMERGENCY DEPARTMENT AT [facility] Provider Note   CSN: [id number] Arrival date & time: 08/20/22  0045     History  Chief Complaint  Patient presents with   Otalgia    Angela Guzman is a 32 y.o. female.   Otalgia Location:  Left Quality:  Aching Severity:  Severe Onset quality:  Sudden Timing:  Constant Progression:  Worsening Chronicity:  New Relieved by:  Nothing Associated symptoms: no ear discharge      Patient reports sudden onset of left ear pain several hours ago.  No trauma.  No drainage.  No fevers or vomiting  Home Medications Prior to Admission medications   Medication Sig Start Date End Date Taking? Authorizing Provider  amoxicillin (AMOXIL) 500 MG capsule Take 2 capsules (1,000 mg total) by mouth 2 (two) times daily. 08/20/22  Yes Ripley Fraise, MD  chlordiazePOXIDE (LIBRIUM) 25 MG capsule '50mg'$  PO TID x 1D, then 25-'50mg'$  PO BID X 1D, then 25-'50mg'$  PO QD X 1D 06/01/22   Valarie Merino, MD  FLUoxetine (PROZAC) 10 MG capsule Take 10 mg by mouth daily. Patient not taking: Reported on 05/02/2022 06/09/20   [provider]  FLUoxetine (PROZAC) 20 MG capsule Take 20 mg by mouth daily. Patient not taking: Reported on 05/02/2022 07/03/20   [provider]  metoprolol succinate (TOPROL XL) 25 MG 24 hr tablet Take 1.5 tablets (37.5 mg total) by mouth at bedtime. Patient taking differently: Take 25 mg by mouth at bedtime. 11/25/21   Shirley Friar, PA-C  multivitamin-iron-minerals-folic acid (CENTRUM) chewable tablet Chew 1 tablet by mouth daily. 06/01/22   Valarie Merino, MD  potassium chloride (KLOR-CON) 10 MEQ tablet Take 1 tablet (10 mEq total) by mouth daily. Patient not taking: Reported on 05/02/2022 08/31/20   Fransico Meadow, PA-C      Allergies    Patient has no known allergies.    Review of Systems   Review of Systems  HENT:  Positive for ear pain. Negative for ear discharge.     Physical  Exam Updated Vital Signs BP (!) 131/93 (BP Location: Right Arm)   Pulse 100   Temp 98.7 F (37.1 C) (Oral)   Resp 18   Ht 1.702 m ('5\' 7"'$ )   Wt 69.4 kg   LMP 07/28/2022 (Approximate)   SpO2 99%   BMI 23.96 kg/m  Physical Exam CONSTITUTIONAL: Well developed/well nourished, anxious HEAD: Normocephalic/atraumatic EYES: EOMI/PERRL ENMT: Mucous membranes moist Ears are symmetric.  Right ear appears unremarkable. No erythema or swelling noted to the left ear.  Left TM has significant erythema with questionable perforation.  No drainage or bleeding.  No foreign bodies NEURO: Pt is awake/alert/appropriate, moves all extremitiesx4.  No facial droop.   SKIN: warm, color normal PSYCH: Anxious  ED Results / Procedures / Treatments   Labs (all labs ordered are listed, but only abnormal results are displayed) Labs Reviewed - No data to display  EKG None  Radiology No results found.  Procedures Procedures    Medications Ordered in ED Medications  amoxicillin (AMOXIL) capsule 500 mg (has no administration in time range)  ibuprofen (ADVIL) tablet 400 mg (has no administration in time range)    ED Course/ Medical Decision Making/ A&P                             Medical Decision Making Risk Prescription drug management.   Patient reports she drove here  so will be unable to get narcotics.  Will give ibuprofen.  Will start on antibiotics.  Advise she may have perforation and avoid placing anything in her ear.  Will refer to otolaryngology        Final Clinical Impression(s) / ED Diagnoses Final diagnoses:  Otalgia of left ear    Rx / DC Orders ED Discharge Orders          Ordered    amoxicillin (AMOXIL) 500 MG capsule  2 times daily        08/20/22 0336              Ripley Fraise, MD 08/20/22 [phone]

## 2022-08-20 NOTE — ED Triage Notes (Signed)
Pt reports left ear pain that started tonight around 9pm
